# Patient Record
Sex: Female | Born: 1975
Health system: Southern US, Community
[De-identification: ages and names within clinical notes are randomized; demographics above are authoritative.]

## PROBLEM LIST (undated history)

## (undated) DIAGNOSIS — B977 Papillomavirus as the cause of diseases classified elsewhere: Secondary | ICD-10-CM

## (undated) DIAGNOSIS — R87619 Unspecified abnormal cytological findings in specimens from cervix uteri: Secondary | ICD-10-CM

## (undated) DIAGNOSIS — R011 Cardiac murmur, unspecified: Secondary | ICD-10-CM

## (undated) DIAGNOSIS — IMO0002 Reserved for concepts with insufficient information to code with codable children: Secondary | ICD-10-CM

## (undated) HISTORY — PX: WISDOM TOOTH EXTRACTION: SHX21

---

## 1996-08-28 HISTORY — PX: CERVICAL CONE BIOPSY: SUR198

## 2000-08-28 HISTORY — PX: RHINOPLASTY: SHX2354

## 2003-12-01 ENCOUNTER — Other Ambulatory Visit: Admission: RE | Admit: 2003-12-01 | Discharge: 2003-12-01 | Payer: Self-pay | Admitting: Obstetrics and Gynecology

## 2004-01-11 ENCOUNTER — Other Ambulatory Visit: Admission: RE | Admit: 2004-01-11 | Discharge: 2004-01-11 | Payer: Self-pay | Admitting: Obstetrics and Gynecology

## 2007-02-22 ENCOUNTER — Ambulatory Visit (HOSPITAL_COMMUNITY): Admission: RE | Admit: 2007-02-22 | Discharge: 2007-02-22 | Payer: Self-pay | Admitting: Obstetrics and Gynecology

## 2007-11-21 ENCOUNTER — Inpatient Hospital Stay (HOSPITAL_COMMUNITY): Admission: AD | Admit: 2007-11-21 | Discharge: 2007-11-25 | Payer: Self-pay | Admitting: Obstetrics and Gynecology

## 2007-11-22 ENCOUNTER — Encounter (INDEPENDENT_AMBULATORY_CARE_PROVIDER_SITE_OTHER): Payer: Self-pay | Admitting: Obstetrics and Gynecology

## 2008-01-23 ENCOUNTER — Ambulatory Visit: Payer: Self-pay | Admitting: Oncology

## 2008-03-20 ENCOUNTER — Ambulatory Visit: Payer: Self-pay | Admitting: Vascular Surgery

## 2008-04-03 ENCOUNTER — Ambulatory Visit: Payer: Self-pay | Admitting: Oncology

## 2009-07-16 ENCOUNTER — Encounter: Admission: RE | Admit: 2009-07-16 | Discharge: 2009-07-16 | Payer: Self-pay | Admitting: Obstetrics and Gynecology

## 2009-12-29 ENCOUNTER — Encounter: Admission: RE | Admit: 2009-12-29 | Discharge: 2009-12-29 | Payer: Self-pay | Admitting: Obstetrics and Gynecology

## 2010-08-28 NOTE — L&D Delivery Note (Signed)
Cesarean Section Procedure Note   [name]  03/22/2011  Indications: Scheduled Proceedure/Maternal Request   Pre-operative Diagnosis: previous cesarean section.   Post-operative Diagnosis: same plus breech   Surgeon: Surgeon(s) and Role:    * Kaiya Boatman C Adamary Savary, MD - Primary   Assistants: none  Anesthesia: spinal   Procedure Details:  The patient was seen in the Holding Room. The risks, benefits, complications, treatment options, and expected outcomes were discussed with the patient. The patient concurred with the proposed plan, giving informed consent. identified as Michele Medina and the procedure verified as C-Section Delivery. A Time Out was held and the above information confirmed.  After induction of anesthesia, the patient was draped and prepped in the usual sterile manner. A transverse was made and carried down through the subcutaneous tissue to the fascia. Fascial incision was made and extended transversely. The fascia was separated from the underlying rectus tissue superiorly and inferiorly. The peritoneum was identified and entered. Peritoneal incision was extended longitudinally. The utero-vesical peritoneal reflection was incised transversely and the bladder flap was bluntly freed from the lower uterine segment. A low transverse uterine incision was made. Delivered from breech  presentation was a  gram Living newborn infant(s) with Apgar scores of 9 at one minute and 9 at five minutes. Cord ph was sent the umbilical cord was clamped and cut cord blood was obtained for evaluation. The placenta was removed Intact and appeared normal. The uterus, tubes and ovaries appeared normal}. The uterine incision was closed with running locked sutures of 0monocry in double layerl.   Hemostasis was observed.  Irrigation was carried out until clear .The peritoneum was closed with  suture material 0 monocryl . The fascia was then reapproximated with running sutures of 0Vicryl.}. The skin was  closed with staples  Sponge and instrument count was normal x 3 and the patient was stable on transfer to recovery room.    Findings:   Estimated Blood Loss 500    Specimens: placenta to l and d  Specimens    None       Complications: {complications:[id number]  Baby condition / location:  nursery-stable  Signed: Surgeon(s): Aquan Kope C Catalea Labrecque, MD  

## 2010-10-20 ENCOUNTER — Inpatient Hospital Stay (HOSPITAL_COMMUNITY): Admission: AD | Admit: 2010-10-20 | Payer: Self-pay | Source: Ambulatory Visit | Admitting: Obstetrics and Gynecology

## 2011-01-10 NOTE — Op Note (Signed)
NAMECOURNEY, GARROD NO.:  [id number]   MEDICAL RECORD NO.:  [id number]          PATIENT TYPE:  INP   LOCATION:  9161                          FACILITY:  [facility]   PHYSICIAN:  Duke Salvia. Marcelle Overlie, M.D.DATE OF BIRTH:  [birth year]   DATE OF PROCEDURE:  11/22/2007  DATE OF DISCHARGE:                               OPERATIVE REPORT   PREOPERATIVE DIAGNOSES:  1. Failure to progress.  2. Cervical abnormality, failure to dilate, prior cold knife      conization.   POSTOPERATIVE DIAGNOSES:  1. Failure to progress.  2. Cervical abnormality, failure to dilate, prior cold knife      conization.   PROCEDURE:  Primary low transverse cesarean section.   SURGEON:  [place].   ANESTHESIA:  Epidural.   COMPLICATIONS:  None.   DRAINS:  Foley catheter.   BLOOD LOSS:  800.   PROCEDURE AND FINDINGS:  The patient was taken to the operating room.  After an adequate level of epidural anesthesia was obtained, the patient  in left tilt position.  The abdomen was prepped in the usual manner for  a sterile abdominal procedure.  A Foley catheter positioned, draining  clear urine.  A transverse Pfannenstiel incision made 2 fingerbreadths  above the symphysis in a transverse fashion after normal prep and drape.   This was carried down to the fascia, which was incised and extended  transversely.  Rectus muscle divided in the midline.  Peritoneum entered  superiorly without incident and extended vertically.  The vesicouterine  serosa was then incised and the bladder was bluntly and sharply  dissected below, bladder blade repositioned.  Transverse incision made  in the lower segment, which was moderately thin and extended with blunt  dissection.  Thin meconium was noted.  The infant was OP, gently rotated  to affect easy delivery of a female, Apgars 8 and 9, pH 7.31.  The  infant was suctioned, cord clamped and passed to the pediatric team for  further care.  Placenta was then delivered  manually intact, uterus  exteriorized, cavity wiped clean with a laparotomy pad.  Closure  obtained.  First layer of 0 chromic in a locked fashion, followed by an  imbricating layer with 0 chromic.  Prior to closure, the cervix was  dilated digitally from above since it had been impossible to figure out  the cervical os from below.  This was done mainly just to facilitate  postpartum drainage of blood.  The uterine incision was then closed in 2  layers and was noted be hemostatic.  Tubes and ovaries were normal on  each side.  Prior to closure sponge, needle and instrument counts were  reported correct x2.  Peritoneum closed with running 2-0 Vicryl suture.  Rectus muscles closed in the midline with 2-0 Vicryl interrupted sutures  and 0 PDS was used to close fascia from laterally to midline on either  side.  Subcutaneous tissue was hemostatic.  Clips and Steri-Strips used on the  skin.  Clear urine noted at the end of the case.  Did receive Pitocin IV  after the cord was clamped and  Ancef 1 gram IV during the case.  Mother  and baby doing well at that point.      Richard M. Marcelle Overlie, M.D.  Electronically Signed     RMH/MEDQ  D:  11/22/2007  T:  11/22/2007  Job:  [id number]

## 2011-01-13 NOTE — Discharge Summary (Signed)
NAMEVENA, BASSINGER NO.:  [id number]   MEDICAL RECORD NO.:  [id number]          PATIENT TYPE:  INP   LOCATION:  9318                          FACILITY:  [facility]   PHYSICIAN:  Dineen Kid. Rana Snare, M.D.    DATE OF BIRTH:  [birth year]   DATE OF ADMISSION:  11/21/2007  DATE OF DISCHARGE:  11/25/2007                               DISCHARGE SUMMARY   ADMITTING DIAGNOSES:  1. Intrauterine pregnancy at term.  2. Spontaneous onset of labor.  3. History of cold-knife conization.   DISCHARGE DIAGNOSES:  1. Status post low transverse cesarean section secondary to failure to      dilate.  2. Viable female infant.   PROCEDURE:  Primary low transverse cesarean section.   REASON FOR ADMISSION:  Please see dictated H&P.   HOSPITAL COURSE:  The patient is a 35 year old gravida 2, para 0,  abortus 1, who presented to [facility] at term in  labor.  Initially, the patient was thought to be fingertip dilated and  later was felt to be completely dilated.  She had a history of a cold-  knife conization.  On admission, vital signs were stable with blood  pressure 123/80.  Physician re-examined the patient and the cervix was  flushed with the vagina and could not find the internal os visually nor  by careful palpation.  Due to the patient's active labor and failure to  dilate, decision was made to proceed with a primary low transverse  cesarean section.  The patient was then transferred to the operating  room where epidural was dosed to an adequate surgical level.  A low  transverse incision was made with delivery of a viable female infant  weighing 8 pounds 1 ounce with Apgars of 8 at 1 minute and 9 and 5  minutes.  Arterial cord pH was 7.31.  The patient tolerated the  procedure well and taken to the recovery room in stable condition.  On  postoperative day 1, the patient was without complaint.  Vital signs  were stable.  She was afebrile.  Abdomen was soft with fundus  firm and  nontender.  Abdominal dressing was noted to be clean, dry, and intact.  Laboratory findings showed hemoglobin of 11.  On postoperative day 2,  the patient was without complaint.  Vital signs remained stable.  She  was afebrile.  Fundus firm and nontender.  Incision was clean, dry, and  intact.  The patient was ambulating well and tolerating a regular diet  without complaints of nausea or vomiting.  On postoperative day 3, the  patient was without complaint.  Vital signs remained stable.  She was  afebrile.  Fundus firm and nontender.  Incision was clean, dry, and  intact.  Staples removed and the patient was later discharged home.   CONDITION ON DISCHARGE:  Stable.   DIET:  Regular as tolerated.   ACTIVITY:  No heavy lifting or driving x2 weeks, no vaginal entry.   FOLLOW UP:  The patient is to follow up in the office in 1-2 weeks for  an incision check.  She is  to call for temperature greater than 100  degrees, persistent nausea, vomiting, heavy vaginal bleeding and/or  redness, or drainage from incisional site.   DISCHARGE MEDICATIONS:  1. Motrin 600 mg every 6 hours.  2. Prenatal vitamins 1 p.o. daily.  3. Colace 1 p.o. daily p.r.n.      Julio Sicks, N.P.      Dineen Kid Rana Snare, M.D.  Electronically Signed    CC/MEDQ  D:  12/12/2007  T:  12/12/2007  Job:  [id number]

## 2011-02-17 ENCOUNTER — Encounter (HOSPITAL_COMMUNITY): Payer: Self-pay | Admitting: *Deleted

## 2011-03-13 ENCOUNTER — Encounter (HOSPITAL_COMMUNITY)
Admission: RE | Admit: 2011-03-13 | Discharge: 2011-03-13 | Disposition: A | Discharge status: Home / Self Care | Payer: 59 | Source: Ambulatory Visit | Attending: Obstetrics and Gynecology | Admitting: Obstetrics and Gynecology

## 2011-03-13 ENCOUNTER — Encounter (HOSPITAL_COMMUNITY): Payer: Self-pay

## 2011-03-13 HISTORY — DX: Cardiac murmur, unspecified: R01.1

## 2011-03-13 LAB — CBC
HCT: 39.4 % (ref 36.0–46.0)
MCV: 88.5 fL (ref 78.0–100.0)
Platelets: 297 10*3/uL (ref 150–400)
RBC: 4.45 MIL/uL (ref 3.87–5.11)
RDW: 13.2 % (ref 11.5–15.5)
WBC: 16.7 10*3/uL — ABNORMAL HIGH (ref 4.0–10.5)

## 2011-03-13 NOTE — Anesthesia Preprocedure Evaluation (Addendum)
Anesthesia Evaluation  Name, MR# and DOB Patient awake  General Assessment Comment  Airway Mallampati: I TM Distance: >3 FB     Dental No notable dental hx    Pulmonaryneg pulmonary ROS      pulmonary exam normal   Cardiovascular    Neuro/PsychNegative Neurological ROS   GI/Hepatic/Renal negative GI ROS, negative Liver ROS, and negative Renal ROS (+)       Endo/Other  Negative Endocrine ROS (+)   Abdominal Normal abdominal exam  (+)   Musculoskeletal negative musculoskeletal ROS (+)  Hematology negative hematology ROS (+)   Peds negative pediatric ROS (+)  Reproductive/Obstetrics (+) Pregnancy   Anesthesia Other Findings             Anesthesia Physical Anesthesia Plan  ASA: II  Anesthesia Plan: Spinal   Post-op Pain Management:    Induction:   Airway Management Planned:   Additional Equipment:   Intra-op Plan:   Post-operative Plan:   Informed Consent: I have reviewed the patients History and Physical, chart, labs and discussed the procedure including the risks, benefits and alternatives for the proposed anesthesia with the patient or authorized representative who has indicated his/her understanding and acceptance.     Plan Discussed with:   Anesthesia Plan Comments:         Anesthesia Quick Evaluation

## 2011-03-13 NOTE — Pre-Procedure Instructions (Signed)
Pharmacy called twice but unable to show up during PAT appt.  Patient is only taking prenatal vitamins.

## 2011-03-13 NOTE — Patient Instructions (Signed)
20 ALEXIANA LAVERDURE  03/13/2011   Your procedure is scheduled on:  03/22/2011  Report to [facility] at 600 AM.  Call this number if you have problems the morning of surgery: [phone]   Remember:   Do not eat food:After Midnight.  Do not drink clear liquids: After Midnight.  Take these medicines the morning of surgery with A SIP OF WATER: none   Do not wear jewelry, make-up or nail polish.  Do not bring valuables to the hospital.  Contacts, dentures or bridgework may not be worn into surgery.  Leave suitcase in the car. After surgery it may be brought to your room.  For patients admitted to the hospital, checkout time is 11:00 AM the day of discharge.   Patients discharged the day of surgery will not be allowed to drive home.  Name and phone number of your driver: Tonda Wiederhold, [phone]  Special Instructions: Hibiclens bath Tuesday night and Wed morning prior to arrival to hospital.   Please read over the following fact sheets that you were given: Pain Booklet and MRSA Information

## 2011-03-19 ENCOUNTER — Inpatient Hospital Stay (HOSPITAL_COMMUNITY)
Admission: AD | Admit: 2011-03-19 | Discharge: 2011-03-19 | Disposition: A | Discharge status: Home / Self Care | Payer: 59 | Source: Ambulatory Visit | Attending: Obstetrics and Gynecology | Admitting: Obstetrics and Gynecology

## 2011-03-19 ENCOUNTER — Encounter (HOSPITAL_COMMUNITY): Payer: Self-pay | Admitting: *Deleted

## 2011-03-19 DIAGNOSIS — O99891 Other specified diseases and conditions complicating pregnancy: Secondary | ICD-10-CM | POA: Insufficient documentation

## 2011-03-19 HISTORY — DX: Papillomavirus as the cause of diseases classified elsewhere: B97.7

## 2011-03-19 HISTORY — DX: Reserved for concepts with insufficient information to code with codable children: IMO0002

## 2011-03-19 HISTORY — DX: Unspecified abnormal cytological findings in specimens from cervix uteri: R87.619

## 2011-03-19 NOTE — Progress Notes (Signed)
Dr. Marcelle Overlie notified of pt stating called and spoke to after-hour nurse about being wet after being outside and possibly sweating, one time this morning, no leakage running down legs.  Dr. Marcelle Overlie declined offer to order [facility] or spec exam.

## 2011-03-19 NOTE — Progress Notes (Signed)
Thinks water broke @ 10am.  Dr. Catalina Pizza her to come in. Scheduled c/s for breech on 03-22-11, G3pp1

## 2011-03-21 MED ORDER — DEXTROSE 5 % IV SOLN
1.0000 g | Freq: Once | INTRAVENOUS | Status: AC
  Administered 2011-03-22: 1 g via INTRAVENOUS
  Filled 2011-03-21: qty 1

## 2011-03-21 NOTE — H&P (Signed)
Michele Medina is a 35 y.o. female presenting for Repeat cesarean section.  EDC 03/29/11.  Prev C/S for FTP The pregnancy has been uncomplicated and she deisres repeat.Marland Kitchen History OB History    Grav Para Term Preterm Abortions TAB SAB Ect Mult Living   3 1 1  1  1   1      Past Medical History  Diagnosis Date  . Heart murmur     as child - no problems  . Abnormal Pap smear   . Human papilloma virus    Past Surgical History  Procedure Date  . Cesarean section 10/2007  . Rhinoplasty 2002  . Cervical cone biopsy 1998  . Wisdom tooth extraction    Family History: family history is not on file. Social History:  reports that she has never smoked. She has never used smokeless tobacco. She reports that she does not drink alcohol or use illicit drugs.  ROS    Last menstrual period 06/22/2010. Exam Physical Exam Cx cl/th/high Abd Gravid nontender and FH 38cm Prenatal labs: ABO, Rh:   Antibody:   Rubella:   RPR: NON REACTIVE (07/16 0850)  HBsAg:    HIV:    GBS:     Assessment/Plan: IUP at 39 weeks for repeat C/S.  R&B were discussed at length .  Informed consent obtained.   Michele Medina C 03/21/2011, 7:52 PM

## 2011-03-22 ENCOUNTER — Encounter (HOSPITAL_COMMUNITY): Payer: Self-pay | Admitting: Anesthesiology

## 2011-03-22 ENCOUNTER — Inpatient Hospital Stay (HOSPITAL_COMMUNITY): Payer: 59 | Admitting: Anesthesiology

## 2011-03-22 ENCOUNTER — Ambulatory Visit (HOSPITAL_COMMUNITY): Admission: RE | Admit: 2011-03-22 | Payer: 59 | Source: Ambulatory Visit | Admitting: Obstetrics and Gynecology

## 2011-03-22 ENCOUNTER — Inpatient Hospital Stay (HOSPITAL_COMMUNITY)
Admission: RE | Admit: 2011-03-22 | Discharge: 2011-03-24 | DRG: 766 | Disposition: A | Discharge status: Home / Self Care | Payer: 59 | Source: Ambulatory Visit | Attending: Obstetrics and Gynecology | Admitting: Obstetrics and Gynecology

## 2011-03-22 ENCOUNTER — Encounter (HOSPITAL_COMMUNITY)
Admission: RE | Disposition: A | Discharge status: Home / Self Care | Payer: Self-pay | Source: Ambulatory Visit | Attending: Obstetrics and Gynecology

## 2011-03-22 DIAGNOSIS — Z01812 Encounter for preprocedural laboratory examination: Secondary | ICD-10-CM

## 2011-03-22 DIAGNOSIS — Z01818 Encounter for other preprocedural examination: Secondary | ICD-10-CM

## 2011-03-22 DIAGNOSIS — O321XX0 Maternal care for breech presentation, not applicable or unspecified: Secondary | ICD-10-CM | POA: Diagnosis present

## 2011-03-22 DIAGNOSIS — O34219 Maternal care for unspecified type scar from previous cesarean delivery: Principal | ICD-10-CM | POA: Diagnosis present

## 2011-03-22 DIAGNOSIS — O09529 Supervision of elderly multigravida, unspecified trimester: Secondary | ICD-10-CM | POA: Diagnosis present

## 2011-03-22 SURGERY — Surgical Case
Anesthesia: Spinal | Site: Abdomen | Wound class: Clean Contaminated

## 2011-03-22 MED ORDER — ONDANSETRON HCL 4 MG/2ML IJ SOLN: 4.0000 mg | Freq: Once | INTRAMUSCULAR | Status: DC | PRN

## 2011-03-22 MED ORDER — NALBUPHINE SYRINGE 5 MG/0.5 ML
5.0000 mg | INJECTION | INTRAMUSCULAR | Status: AC | PRN
  Filled 2011-03-22: qty 1

## 2011-03-22 MED ORDER — SODIUM CHLORIDE 0.9 % IJ SOLN: 3.0000 mL | INTRAMUSCULAR | Status: DC | PRN

## 2011-03-22 MED ORDER — KETOROLAC TROMETHAMINE 30 MG/ML IJ SOLN: 30.0000 mg | Freq: Four times a day (QID) | INTRAMUSCULAR | Status: AC | PRN

## 2011-03-22 MED ORDER — MEPERIDINE HCL 25 MG/ML IJ SOLN: 6.2500 mg | INTRAMUSCULAR | Status: DC | PRN

## 2011-03-22 MED ORDER — LACTATED RINGERS IV SOLN
INTRAVENOUS | Status: DC
  Administered 2011-03-22 (×2): via INTRAVENOUS

## 2011-03-22 MED ORDER — SODIUM CHLORIDE 0.9 % IV SOLN
1.0000 ug/kg/h | INTRAVENOUS | Status: DC | PRN
  Filled 2011-03-22: qty 2.5

## 2011-03-22 MED ORDER — TETANUS-DIPHTH-ACELL PERTUSSIS 5-2.5-18.5 LF-MCG/0.5 IM SUSP: 0.5000 mL | Freq: Once | INTRAMUSCULAR | Status: DC

## 2011-03-22 MED ORDER — PHENYLEPHRINE 40 MCG/ML (10ML) SYRINGE FOR IV PUSH (FOR BLOOD PRESSURE SUPPORT)
PREFILLED_SYRINGE | INTRAVENOUS | Status: AC
  Filled 2011-03-22: qty 15

## 2011-03-22 MED ORDER — SCOPOLAMINE 1 MG/3DAYS TD PT72
1.0000 | MEDICATED_PATCH | Freq: Once | TRANSDERMAL | Status: DC
  Administered 2011-03-22: 1.5 mg via TRANSDERMAL

## 2011-03-22 MED ORDER — WITCH HAZEL-GLYCERIN EX PADS: MEDICATED_PAD | CUTANEOUS | Status: DC | PRN

## 2011-03-22 MED ORDER — OXYTOCIN 20 UNITS IN LACTATED RINGERS INFUSION - SIMPLE
INTRAVENOUS | Status: AC
  Administered 2011-03-22: 20000 m[IU]
  Filled 2011-03-22: qty 1000

## 2011-03-22 MED ORDER — OXYTOCIN 20 UNITS IN LACTATED RINGERS INFUSION - SIMPLE
INTRAVENOUS | Status: DC | PRN
  Administered 2011-03-22: 20 [IU] via INTRAVENOUS

## 2011-03-22 MED ORDER — IBUPROFEN 600 MG PO TABS
600.0000 mg | ORAL_TABLET | Freq: Four times a day (QID) | ORAL | Status: DC | PRN
  Filled 2011-03-22 (×8): qty 1

## 2011-03-22 MED ORDER — DIPHENHYDRAMINE HCL 25 MG PO CAPS: 25.0000 mg | ORAL_CAPSULE | Freq: Four times a day (QID) | ORAL | Status: DC | PRN

## 2011-03-22 MED ORDER — ONDANSETRON HCL 4 MG PO TABS: 4.0000 mg | ORAL_TABLET | ORAL | Status: DC | PRN

## 2011-03-22 MED ORDER — ZOLPIDEM TARTRATE 5 MG PO TABS: 5.0000 mg | ORAL_TABLET | Freq: Every evening | ORAL | Status: DC | PRN

## 2011-03-22 MED ORDER — KETOROLAC TROMETHAMINE 60 MG/2ML IM SOLN
INTRAMUSCULAR | Status: AC
  Administered 2011-03-22: 60 mg via INTRAMUSCULAR
  Filled 2011-03-22: qty 2

## 2011-03-22 MED ORDER — PHENYLEPHRINE HCL 10 MG/ML IJ SOLN
INTRAMUSCULAR | Status: DC | PRN
  Administered 2011-03-22 (×3): 40 ug via INTRAVENOUS

## 2011-03-22 MED ORDER — OXYTOCIN 20 UNITS IN LACTATED RINGERS INFUSION - SIMPLE: 125.0000 mL/h | INTRAVENOUS | Status: AC

## 2011-03-22 MED ORDER — ONDANSETRON HCL 4 MG/2ML IJ SOLN
INTRAMUSCULAR | Status: DC | PRN
  Administered 2011-03-22: 4 mg via INTRAVENOUS

## 2011-03-22 MED ORDER — IBUPROFEN 600 MG PO TABS: 600.0000 mg | ORAL_TABLET | Freq: Four times a day (QID) | ORAL | Status: DC | PRN

## 2011-03-22 MED ORDER — ONDANSETRON HCL 4 MG/2ML IJ SOLN
INTRAMUSCULAR | Status: AC
  Filled 2011-03-22: qty 2

## 2011-03-22 MED ORDER — OXYTOCIN 10 UNIT/ML IJ SOLN
INTRAMUSCULAR | Status: AC
  Filled 2011-03-22: qty 4

## 2011-03-22 MED ORDER — FENTANYL CITRATE 0.05 MG/ML IJ SOLN
INTRAMUSCULAR | Status: DC | PRN
Start: 2011-03-22 — End: 2011-03-22
  Administered 2011-03-22: 20 ug via INTRAVENOUS

## 2011-03-22 MED ORDER — KETOROLAC TROMETHAMINE 60 MG/2ML IM SOLN
60.0000 mg | Freq: Once | INTRAMUSCULAR | Status: AC | PRN
  Administered 2011-03-22: 60 mg via INTRAMUSCULAR

## 2011-03-22 MED ORDER — ONDANSETRON HCL 4 MG/2ML IJ SOLN: 4.0000 mg | INTRAMUSCULAR | Status: DC | PRN

## 2011-03-22 MED ORDER — SIMETHICONE 80 MG PO CHEW
80.0000 mg | CHEWABLE_TABLET | ORAL | Status: DC | PRN
  Administered 2011-03-24: 80 mg via ORAL

## 2011-03-22 MED ORDER — MENTHOL 3 MG MT LOZG: 1.0000 | LOZENGE | OROMUCOSAL | Status: DC | PRN

## 2011-03-22 MED ORDER — IBUPROFEN 600 MG PO TABS
600.0000 mg | ORAL_TABLET | Freq: Four times a day (QID) | ORAL | Status: DC
  Administered 2011-03-22 – 2011-03-24 (×8): 600 mg via ORAL
  Filled 2011-03-22: qty 1

## 2011-03-22 MED ORDER — SENNOSIDES-DOCUSATE SODIUM 8.6-50 MG PO TABS
1.0000 | ORAL_TABLET | Freq: Every day | ORAL | Status: DC
  Administered 2011-03-22: 2 via ORAL
  Administered 2011-03-23: 1 via ORAL

## 2011-03-22 MED ORDER — NALOXONE HCL 0.4 MG/ML IJ SOLN: 0.4000 mg | INTRAMUSCULAR | Status: DC | PRN

## 2011-03-22 MED ORDER — HYDROCODONE-IBUPROFEN 7.5-200 MG PO TABS: 1.0000 | ORAL_TABLET | Freq: Four times a day (QID) | ORAL | Status: DC | PRN

## 2011-03-22 MED ORDER — KETOROLAC TROMETHAMINE 60 MG/2ML IM SOLN: 60.0000 mg | Freq: Once | INTRAMUSCULAR | Status: AC | PRN

## 2011-03-22 MED ORDER — SCOPOLAMINE 1 MG/3DAYS TD PT72
MEDICATED_PATCH | TRANSDERMAL | Status: AC
  Administered 2011-03-22: 1.5 mg via TRANSDERMAL
  Filled 2011-03-22: qty 1

## 2011-03-22 MED ORDER — LACTATED RINGERS IV SOLN
INTRAVENOUS | Status: DC
  Administered 2011-03-22 (×3): via INTRAVENOUS

## 2011-03-22 MED ORDER — FENTANYL CITRATE 0.05 MG/ML IJ SOLN: 25.0000 ug | INTRAMUSCULAR | Status: DC | PRN

## 2011-03-22 MED ORDER — ACETAMINOPHEN 325 MG PO TABS: 325.0000 mg | ORAL_TABLET | ORAL | Status: DC | PRN

## 2011-03-22 MED ORDER — PRENATAL PLUS 27-1 MG PO TABS
1.0000 | ORAL_TABLET | Freq: Every day | ORAL | Status: DC
  Administered 2011-03-23 – 2011-03-24 (×2): 1 via ORAL
  Filled 2011-03-22 (×2): qty 1

## 2011-03-22 MED ORDER — SIMETHICONE 80 MG PO CHEW
80.0000 mg | CHEWABLE_TABLET | Freq: Three times a day (TID) | ORAL | Status: DC
  Administered 2011-03-22 – 2011-03-23 (×6): 80 mg via ORAL

## 2011-03-22 MED ORDER — FENTANYL CITRATE 0.05 MG/ML IJ SOLN
INTRAMUSCULAR | Status: AC
  Filled 2011-03-22: qty 2

## 2011-03-22 MED ORDER — MORPHINE SULFATE (PF) 0.5 MG/ML IJ SOLN
INTRAMUSCULAR | Status: DC | PRN
  Administered 2011-03-22: 150 ug via EPIDURAL

## 2011-03-22 MED ORDER — MORPHINE SULFATE 0.5 MG/ML IJ SOLN
INTRAMUSCULAR | Status: AC
  Filled 2011-03-22: qty 10

## 2011-03-22 MED ORDER — EPHEDRINE SULFATE 50 MG/ML IJ SOLN
INTRAMUSCULAR | Status: DC | PRN
  Administered 2011-03-22 (×8): 5 mg via INTRAVENOUS

## 2011-03-22 SURGICAL SUPPLY — 28 items
CLOTH BEACON ORANGE TIMEOUT ST (SAFETY) ×2 IMPLANT
DRAPE UTILITY XL STRL (DRAPES) ×2 IMPLANT
DRESSING TELFA 8X3 (GAUZE/BANDAGES/DRESSINGS) IMPLANT
ELECT REM PT RETURN 9FT ADLT (ELECTROSURGICAL) ×2
ELECTRODE REM PT RTRN 9FT ADLT (ELECTROSURGICAL) ×1 IMPLANT
EXTRACTOR VACUUM M CUP 4 TUBE (SUCTIONS) IMPLANT
GAUZE SPONGE 4X4 12PLY STRL LF (GAUZE/BANDAGES/DRESSINGS) IMPLANT
GLOVE BIO SURGEON STRL SZ8 (GLOVE) ×2 IMPLANT
GLOVE SURG ORTHO 8.0 STRL STRW (GLOVE) ×2 IMPLANT
GOWN PREVENTION PLUS LG XLONG (DISPOSABLE) ×4 IMPLANT
GOWN STRL REIN XL XLG (GOWN DISPOSABLE) ×2 IMPLANT
KIT ABG SYR 3ML LUER SLIP (SYRINGE) ×2 IMPLANT
NDL HYPO 25X5/8 SAFETYGLIDE (NEEDLE) ×1 IMPLANT
NEEDLE HYPO 25X5/8 SAFETYGLIDE (NEEDLE) ×2 IMPLANT
NS IRRIG 1000ML POUR BTL (IV SOLUTION) ×2 IMPLANT
PACK C SECTION WH (CUSTOM PROCEDURE TRAY) ×2 IMPLANT
PAD ABD 7.5X8 STRL (GAUZE/BANDAGES/DRESSINGS) IMPLANT
SLEEVE SCD COMPRESS KNEE MED (MISCELLANEOUS) IMPLANT
STAPLER VISISTAT 35W (STAPLE) ×1 IMPLANT
SUT MNCRL 0 VIOLET CTX 36 (SUTURE) ×3 IMPLANT
SUT MONOCRYL 0 CTX 36 (SUTURE) ×3
SUT PDS AB 1 CT  36 (SUTURE)
SUT PDS AB 1 CT 36 (SUTURE) IMPLANT
SUT VIC AB 1 CTX 36 (SUTURE) ×2
SUT VIC AB 1 CTX36XBRD ANBCTRL (SUTURE) IMPLANT
TOWEL OR 17X24 6PK STRL BLUE (TOWEL DISPOSABLE) ×4 IMPLANT
TRAY FOLEY CATH 14FR (SET/KITS/TRAYS/PACK) ×2 IMPLANT
WATER STERILE IRR 1000ML POUR (IV SOLUTION) ×2 IMPLANT

## 2011-03-22 NOTE — Procedures (Signed)
Cesarean Section Procedure Note   Michele Medina  03/22/2011  Indications: Scheduled Proceedure/Maternal Request   Pre-operative Diagnosis: previous cesarean section.   Post-operative Diagnosis: same plus breech   Surgeon: Surgeon(s) and Role:    * Turner Daniels, MD - Primary   Assistants: none  Anesthesia: spinal   Procedure Details:  The patient was seen in the Holding Room. The risks, benefits, complications, treatment options, and expected outcomes were discussed with the patient. The patient concurred with the proposed plan, giving informed consent. identified as Michele Medina and the procedure verified as C-Section Delivery. A Time Out was held and the above information confirmed.  After induction of anesthesia, the patient was draped and prepped in the usual sterile manner. A transverse was made and carried down through the subcutaneous tissue to the fascia. Fascial incision was made and extended transversely. The fascia was separated from the underlying rectus tissue superiorly and inferiorly. The peritoneum was identified and entered. Peritoneal incision was extended longitudinally. The utero-vesical peritoneal reflection was incised transversely and the bladder flap was bluntly freed from the lower uterine segment. A low transverse uterine incision was made. Delivered from breech  presentation was a  gram Living newborn infant(s) with Apgar scores of 9 at one minute and 9 at five minutes. Cord ph was sent the umbilical cord was clamped and cut cord blood was obtained for evaluation. The placenta was removed Intact and appeared normal. The uterus, tubes and ovaries appeared normal}. The uterine incision was closed with running locked sutures of in double layerl.   Hemostasis was observed.  Irrigation was carried out until clear .The peritoneum was closed with  suture material 0 monocryl . The fascia was then reapproximated with running sutures of 0Vicryl.}. The skin was  closed with staples  Sponge and instrument count was normal x 3 and the patient was stable on transfer to recovery room.    Findings:   Estimated Blood Loss 500    Specimens: placenta to l and d  Specimens    None       Complications: {complications:[id number]  Baby condition / location:  nursery-stable  Signed: Surgeon(s): Turner Daniels, MD

## 2011-03-22 NOTE — Progress Notes (Signed)
Postural BP 77/49.  Lying BP 101/57.  Patient became dizzy and did not ambulate.  Once patient sat back down in bed, she had no further complaints of dizziness.  Notified Dr. Jean Rosenthal per orders.  No new orders received.  Earl Gala, Linda Hedges [place]

## 2011-03-22 NOTE — Consult Note (Signed)
Called to attend scheduled repeat C/section for 35 yo G3 P1 mother at [redacted] wks EGA after uncomplicated pregnancy.  No labor, AROM with clear fluid at delivery.  Breech extraction.  Infant vigorous -  No resuscitation needed. Wrapped and shown to mother, then taken to CN for further care per Dr. Christean Leaf Peds  JWimmer,MD

## 2011-03-22 NOTE — Anesthesia Postprocedure Evaluation (Signed)
  Anesthesia Post-op Note  Patient: [name]  Procedure(s) Performed:  CESAREAN SECTION - Repeat  Patient is awake, responsive, moving her legs, and has signs of resolution of her numbness. Pain and nausea are reasonably well controlled. Vital signs are stable and clinically acceptable. Oxygen saturation is clinically acceptable. There are no apparent anesthetic complications at this time. Patient is ready for discharge.

## 2011-03-22 NOTE — Progress Notes (Signed)
Lactation brochure reviewed with mom, community resources for breastfeeding mothers discussed, advised of outpatient services if needed.

## 2011-03-22 NOTE — Transfer of Care (Signed)
Immediate Anesthesia Transfer of Care Note  Patient: Michele Medina  Procedure(s) Performed:  CESAREAN SECTION - Repeat  Patient Location: PACU  Anesthesia Type: Spinal  Level of Consciousness: awake, alert  and oriented  Airway & Oxygen Therapy: Patient Spontanous Breathing  Post-op Assessment: Report given to PACU RN and Post -op Vital signs reviewed and stable  Post vital signs: Reviewed and stable  Complications: No apparent anesthesia complications

## 2011-03-22 NOTE — Anesthesia Procedure Notes (Signed)
Spinal Block  Patient location during procedure: OR Staffing Anesthesiologist: Rabiah Goeser EDWARD Preanesthetic Checklist Completed: patient identified, site marked, surgical consent, pre-op evaluation, timeout performed, IV checked, risks and benefits discussed and monitors and equipment checked Spinal Block Patient position: sitting Prep: DuraPrep Patient monitoring: heart rate, cardiac monitor, continuous pulse ox and blood pressure Approach: midline Location: L3-4 Injection technique: single-shot Needle Needle type: Sprotte  Needle gauge: 24 G Needle length: 9 cm Assessment Sensory level: T4 Additional Notes Spinal Dosage in OR  Bupivicaine ml       1.5 PFMS04   mcg        150 Fentanyl mcg            20    

## 2011-03-23 LAB — CBC
Platelets: 250 10*3/uL (ref 150–400)
RDW: 13.7 % (ref 11.5–15.5)
WBC: 15 10*3/uL — ABNORMAL HIGH (ref 4.0–10.5)

## 2011-03-23 MED ORDER — KETOROLAC TROMETHAMINE 60 MG/2ML IM SOLN
INTRAMUSCULAR | Status: AC
  Filled 2011-03-23: qty 2

## 2011-03-23 NOTE — Progress Notes (Signed)
Subjective: Postpartum Day 1: Cesarean Delivery Patient reports tolerating PO and + flatus.    Objective: Vital signs in last 24 hours: Temp:  [97.6 F (36.4 C)-99.5 F (37.5 C)] 98.4 F (36.9 C) (07/26 0516) Pulse Rate:  [60-89] 84  (07/26 0516) Resp:  [16-20] 18  (07/26 0516) BP: (77-122)/(49-70) 96/61 mmHg (07/26 0516) SpO2:  [95 %-100 %] 96 % (07/26 0516) Weight:  [72.576 kg (160 lb)] 160 lb (72.576 kg) (07/25 1245)  Physical Exam:  General: alert, cooperative and no distress Lochia: appropriate Uterine Fundus: firm ABd dressing CDI Foley discontinued and patient has voided x1 with adequate op DVT Evaluation: No evidence of DVT seen on physical exam.   Basename 03/23/11 0540  HGB 9.8*  HCT 29.8*    Assessment/Plan: Status post Cesarean section. Doing well postoperatively.  Continue current care.  CURTIS,CAROL G 03/23/2011, 8:26 AM

## 2011-03-23 NOTE — Progress Notes (Signed)
Experienced BF Mother. States BF going well. Baby was circ'd this am. Questions about pumping at work answered. No further questions.

## 2011-03-24 MED ORDER — IBUPROFEN 600 MG PO TABS: 600.0000 mg | ORAL_TABLET | Freq: Four times a day (QID) | ORAL | Status: AC | PRN

## 2011-03-24 NOTE — Progress Notes (Signed)
Multiple, frequent swallows heard.  Mom's nipples w/mild abrasions.  Discussed proper shape of nipple after feed, etc.  Michele Medina, Michele Medina [name]

## 2011-03-24 NOTE — Progress Notes (Signed)
Subjective: Postpartum Day 2: Cesarean Delivery Patient reports tolerating PO, + flatus and no problems voiding.  Desires early discharge  Objective: Vital signs in last 24 hours: Temp:  [97.9 F (36.6 C)-98.1 F (36.7 C)] 97.9 F (36.6 C) (07/27 0642) Pulse Rate:  [73-83] 73  (07/27 0642) Resp:  [18] 18  (07/27 0642) BP: (90-98)/(53-61) 98/61 mmHg (07/27 0642) SpO2:  [96 %] 96 % (07/26 1900)  Physical Exam:  General: alert and cooperative Lochia: appropriate Uterine Fundus: firm Incision: healing well, staples intact DVT Evaluation: No evidence of DVT seen on physical exam.   Basename 03/23/11 0540  HGB 9.8*  HCT 29.8*    Assessment/Plan: Status post Cesarean section. Doing well postoperatively.  Discharge home with standard precautions and return to clinic in 2-3 days for staple removal.  Thoren Hosang G 03/24/2011, 8:06 AM

## 2011-03-24 NOTE — Discharge Summary (Signed)
Obstetric Discharge Summary Reason for Admission: cesarean section Prenatal Procedures: none Intrapartum Procedures: cesarean: low cervical, transverse Postpartum Procedures: none Complications-Operative and Postpartum: none  Hemoglobin  Date Value Range Status  03/23/2011 9.8* 12.0-15.0 (g/dL) Final     HCT  Date Value Range Status  03/23/2011 29.8* 36.0-46.0 (%) Final    Discharge Diagnoses: Term Pregnancy-delivered  Discharge Information: Date: 03/24/2011 Activity: pelvic rest Diet: routine Medications: PNV and Ibuprophen and Oxycodone 5 mgs Condition: stable Instructions: refer to practice specific booklet Discharge to: home   Newborn Data: Live born  Information for the patient's newborn:  Kolbi, Tofte [[id number]]  female  Home with mother. RTO in 2-3 days for staple removal  Karryn Kosinski G 03/24/2011, 8:17 AM

## 2011-03-24 NOTE — Progress Notes (Signed)
Mom reports nipple misshapening with feeds.  Mom to call for next feed.  Lurline Hare [place]

## 2011-04-13 ENCOUNTER — Encounter (HOSPITAL_COMMUNITY): Payer: Self-pay | Admitting: Obstetrics and Gynecology

## 2011-05-08 IMAGING — MG MM DIGITAL DIAGNOSTIC BILAT
5 series · 5 of 5 positions shown · non-contrast
Comparison: None

CLINICAL DATA: The patient has completed breast feeding 2 months
ago, now feels palpable right subareolar area

DIGITAL DIAGNOSTIC  BILATERAL  MAMMOGRAM  WITH CAD AND  RIGHT
BREAST ULTRASOUND:

[R CC]
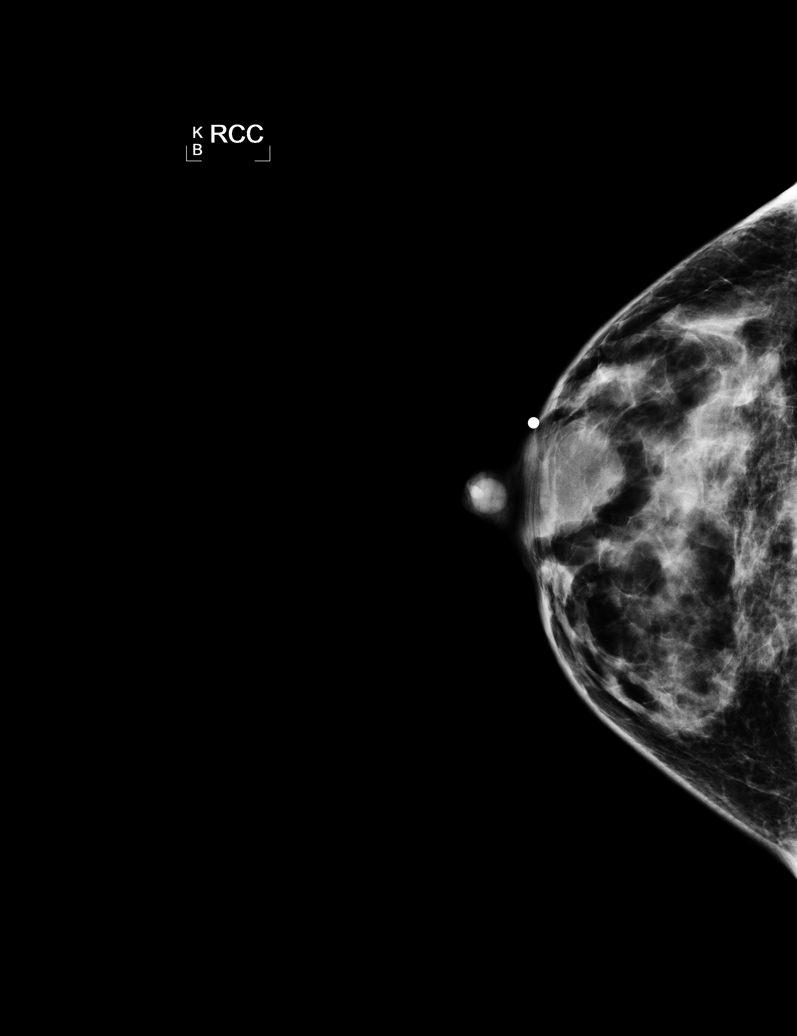

[L CC]
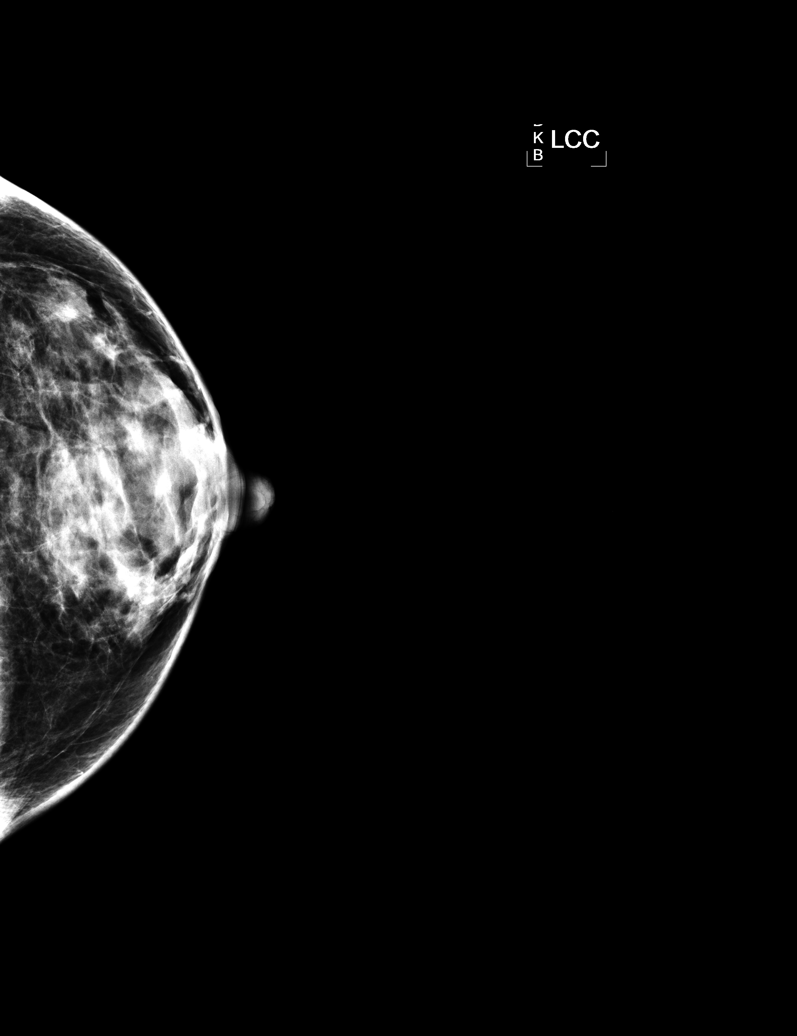

[L MLO]
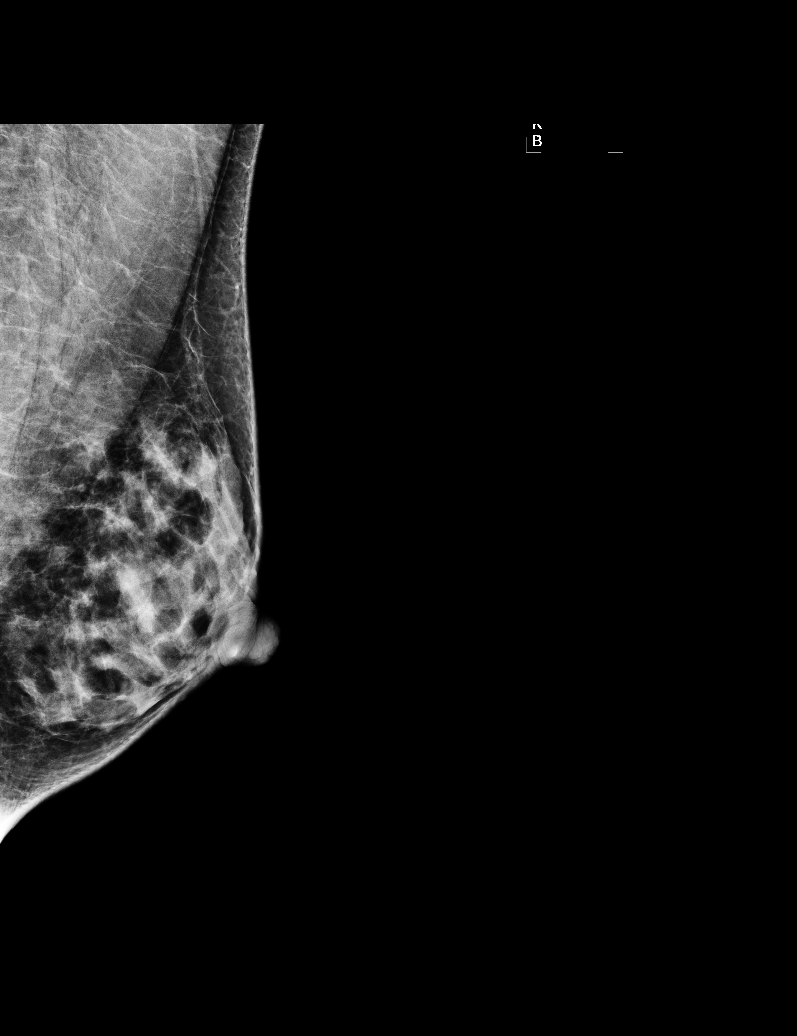

[R MLO]
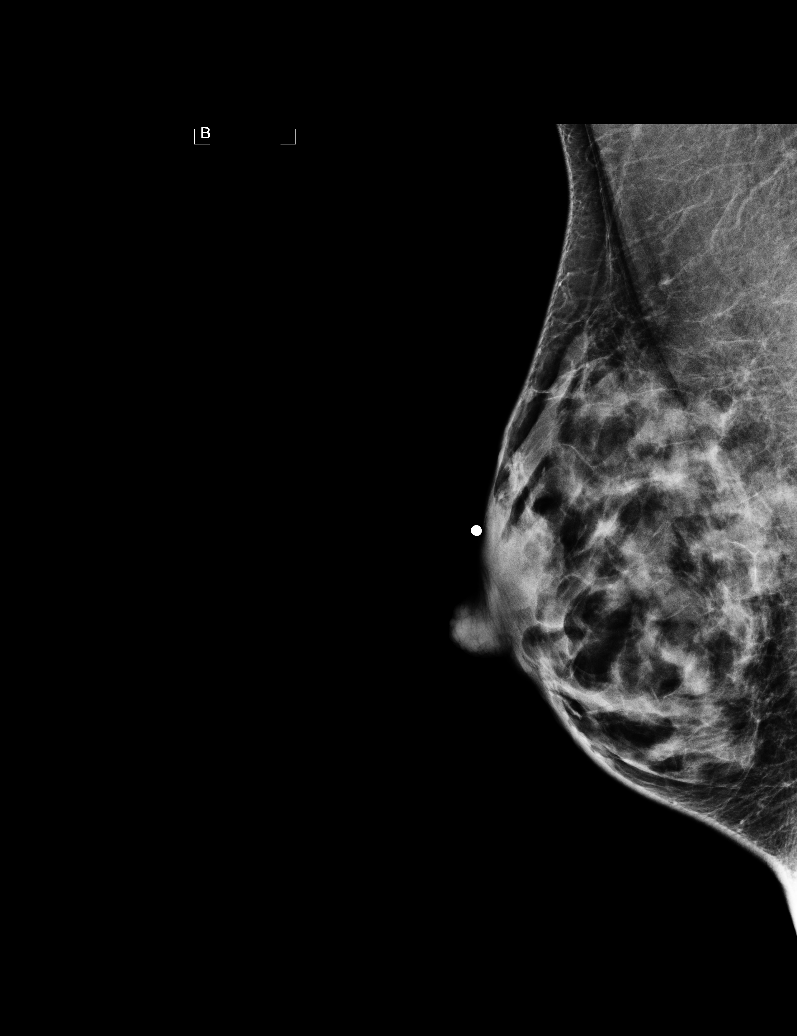

[R TAN]
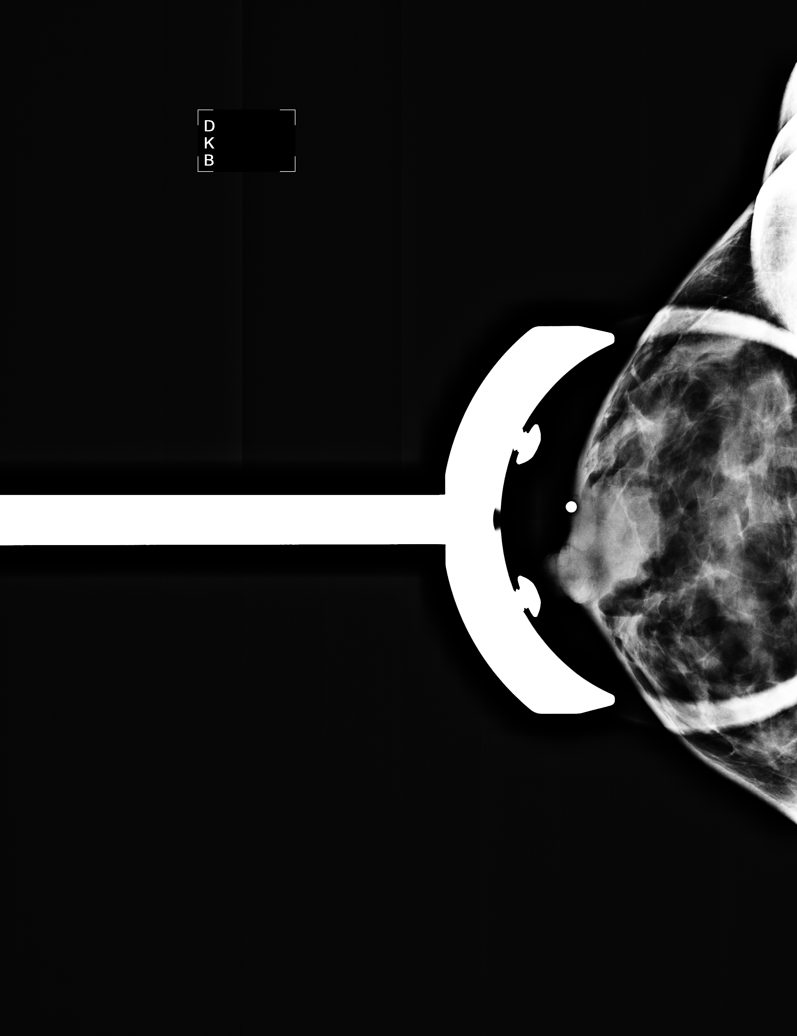

[5 of 5 positions shown; findings below may reference images not displayed]

FINDINGS: Heterogeneously dense breast tissue.  Negative left
breast.  Area of asymmetry subareolar right breast corresponding to
palpable abnormality.  On spot compression view no definite mass or
architectural distortion however.
Mammographic images were processed with CAD.

On physical exam, the palpable area is a ridge of prominent tissue
in the 10 o'clock periareolar area with no focal mass appreciated.

Ultrasound is performed, showing dense fibroglandular tissue in the
area of interest.  No mass or cyst.
IMPRESSION: The area of palpable concern appears to correlate to an island of
prominent fibroglandular tissue.

BI-RADS CATEGORY 3:  Probably benign finding(s) - short interval
follow-up suggested.

Recommendation:

Diagnostic right mammogram and ultrasound in 6 months.

## 2011-05-12 ENCOUNTER — Encounter (HOSPITAL_COMMUNITY): Payer: Self-pay | Admitting: *Deleted

## 2011-05-22 LAB — CBC
HCT: 32.5 — ABNORMAL LOW
Hemoglobin: 11.1 — ABNORMAL LOW
Hemoglobin: 12.4
MCV: 87.6
RBC: 3.7 — ABNORMAL LOW
RBC: 4.13
RDW: 13.5
WBC: 15.5 — ABNORMAL HIGH
WBC: 17.4 — ABNORMAL HIGH

## 2012-10-15 ENCOUNTER — Other Ambulatory Visit (HOSPITAL_COMMUNITY): Payer: Self-pay | Admitting: Otolaryngology

## 2012-10-15 DIAGNOSIS — Q892 Congenital malformations of other endocrine glands: Secondary | ICD-10-CM

## 2012-10-17 ENCOUNTER — Emergency Department (HOSPITAL_COMMUNITY)
Admission: EM | Admit: 2012-10-17 | Discharge: 2012-10-17 | Disposition: A | Discharge status: Home / Self Care | Payer: 59 | Source: Home / Self Care | Attending: Family Medicine | Admitting: Family Medicine

## 2012-10-17 ENCOUNTER — Other Ambulatory Visit (HOSPITAL_COMMUNITY): Payer: Self-pay | Admitting: Otolaryngology

## 2012-10-17 ENCOUNTER — Encounter (HOSPITAL_COMMUNITY): Payer: Self-pay | Admitting: Emergency Medicine

## 2012-10-17 ENCOUNTER — Ambulatory Visit (HOSPITAL_COMMUNITY)
Admission: RE | Admit: 2012-10-17 | Discharge: 2012-10-17 | Disposition: A | Discharge status: Home / Self Care | Payer: 59 | Source: Ambulatory Visit | Attending: Otolaryngology | Admitting: Otolaryngology

## 2012-10-17 DIAGNOSIS — Q892 Congenital malformations of other endocrine glands: Secondary | ICD-10-CM

## 2012-10-17 DIAGNOSIS — R220 Localized swelling, mass and lump, head: Secondary | ICD-10-CM | POA: Insufficient documentation

## 2012-10-17 DIAGNOSIS — J069 Acute upper respiratory infection, unspecified: Secondary | ICD-10-CM

## 2012-10-17 LAB — POCT RAPID STREP A: Streptococcus, Group A Screen (Direct): NEGATIVE

## 2012-10-17 MED ORDER — IPRATROPIUM BROMIDE 0.06 % NA SOLN: 2.0000 | Freq: Four times a day (QID) | NASAL | Status: DC

## 2012-10-17 NOTE — ED Provider Notes (Signed)
History     CSN: [id number]  Arrival date & time 10/17/12  1508   First MD Initiated Contact with Patient 10/17/12 1616      Chief Complaint  Patient presents with  . Sore Throat    (Consider location/radiation/quality/duration/timing/severity/associated sxs/prior treatment) Patient is a 37 y.o. female presenting with pharyngitis. The history is provided by the patient.  Sore Throat This is a new problem. The current episode started more than 2 days ago. The problem has not changed since onset.The symptoms are aggravated by swallowing.    Past Medical History  Diagnosis Date  . Heart murmur     as child - no problems  . Abnormal Pap smear   . Human papilloma virus     Past Surgical History  Procedure Laterality Date  . Cesarean section  10/2007  . Rhinoplasty  2002  . Cervical cone biopsy  1998  . Wisdom tooth extraction    . Cesarean section  03/22/2011    Procedure: CESAREAN SECTION;  Surgeon: Turner Daniels, MD;  Location: [facility];  Service: Gynecology;  Laterality: N/A;  Repeat    No family history on file.  History  Substance Use Topics  . Smoking status: Never Smoker   . Smokeless tobacco: Never Used  . Alcohol Use: No    OB History   Grav Para Term Preterm Abortions TAB SAB Ect Mult Living   3 2 2  1  1   2       Review of Systems  Constitutional: Negative.  Negative for fever and chills.  HENT: Positive for congestion, sore throat, rhinorrhea, voice change and postnasal drip. Negative for trouble swallowing.   Respiratory: Negative.   Cardiovascular: Negative.   Skin: Negative for rash.    Allergies  Tylenol  Home Medications   Current Outpatient Rx  Name  Route  Sig  Dispense  Refill  . ipratropium (ATROVENT) 0.06 % nasal spray   Nasal   Place 2 sprays into the nose 4 (four) times daily.   15 mL   1   . Prenatal Vit-Fe Fum-FA-Omega (PRENATAL MULTI +DHA PO)   Oral   Take 1 tablet by mouth at bedtime.             BP 129/83  Pulse 109   Temp(Src) 100.7 F (38.2 C) (Oral)  SpO2 100%  Breastfeeding? Yes  Physical Exam  Nursing note and vitals reviewed. Constitutional: She is oriented to person, place, and time. She appears well-developed and well-nourished.  HENT:  Head: Normocephalic.  Right Ear: External ear normal.  Left Ear: External ear normal.  Nose: Nose normal.  Mouth/Throat: Oropharynx is clear and moist.  Eyes: Pupils are equal, round, and reactive to light.  Neck: Normal range of motion. Neck supple.  Cardiovascular: Normal rate and normal heart sounds.   Pulmonary/Chest: Effort normal and breath sounds normal.  Lymphadenopathy:    She has cervical adenopathy.  Neurological: She is alert and oriented to person, place, and time.  Skin: Skin is warm and dry.    ED Course  Procedures (including critical care time)  Labs Reviewed  POCT RAPID STREP A ([facility] C[facility] ONLY)   US Soft Tissue Head/neck  10/17/2012  *RADIOLOGY REPORT*  Clinical Data: Neck mass.  ULTRASOUND OF HEAD/NECK SOFT TISSUES  Technique:  Ultrasound examination of the head and neck soft tissues was performed in the area of clinical concern.  Comparison:  None.  Findings: At the site of palpable abnormality in the  midline neck, there are two lymph nodes measuring up to 1.5 cm in long axis. Additional lymph nodes are seen in the lateral aspect of the neck bilaterally, measuring up to 2.4 cm in long axis on the right and 2.5 cm in long axis on the left.  IMPRESSION: Scattered lymph nodes in the neck, some of which are mildly enlarged.   Original Report Authenticated By: Leanna Battles, M.D.      1. URI (upper respiratory infection)       MDM  Rapid strep  Neg.        Linna Hoff, MD 10/17/12 [phone]

## 2012-10-17 NOTE — ED Notes (Signed)
Pt c/o sore throat since Saturday Sx include: chest congestion, dysphagia Denies: f/v/n/d, cough, runny nose Took advil today around 0500  She is alert w/no signs of acute distress.

## 2014-06-29 ENCOUNTER — Encounter (HOSPITAL_COMMUNITY): Payer: Self-pay | Admitting: Emergency Medicine

## 2014-07-13 ENCOUNTER — Other Ambulatory Visit: Payer: Self-pay | Admitting: Obstetrics and Gynecology

## 2014-07-14 LAB — CYTOLOGY - PAP

## 2014-09-15 ENCOUNTER — Other Ambulatory Visit (HOSPITAL_COMMUNITY): Payer: Self-pay | Admitting: Urology

## 2014-09-15 DIAGNOSIS — R3129 Other microscopic hematuria: Secondary | ICD-10-CM

## 2014-09-21 ENCOUNTER — Ambulatory Visit (HOSPITAL_COMMUNITY)
Admission: RE | Admit: 2014-09-21 | Discharge: 2014-09-21 | Disposition: A | Discharge status: Home / Self Care | Payer: 59 | Source: Ambulatory Visit | Attending: Urology | Admitting: Urology

## 2014-09-21 ENCOUNTER — Encounter (HOSPITAL_COMMUNITY): Payer: Self-pay

## 2014-09-21 DIAGNOSIS — R934 Abnormal findings on diagnostic imaging of urinary organs: Secondary | ICD-10-CM | POA: Diagnosis not present

## 2014-09-21 DIAGNOSIS — R312 Other microscopic hematuria: Secondary | ICD-10-CM | POA: Diagnosis not present

## 2014-09-21 DIAGNOSIS — R932 Abnormal findings on diagnostic imaging of liver and biliary tract: Secondary | ICD-10-CM | POA: Insufficient documentation

## 2014-09-21 DIAGNOSIS — R3129 Other microscopic hematuria: Secondary | ICD-10-CM

## 2014-09-21 MED ORDER — IOHEXOL 300 MG/ML  SOLN
100.0000 mL | Freq: Once | INTRAMUSCULAR | Status: AC | PRN
  Administered 2014-09-21: 100 mL via INTRAVENOUS

## 2014-11-09 ENCOUNTER — Emergency Department (INDEPENDENT_AMBULATORY_CARE_PROVIDER_SITE_OTHER): Payer: 59

## 2014-11-09 ENCOUNTER — Encounter (HOSPITAL_COMMUNITY): Payer: Self-pay

## 2014-11-09 ENCOUNTER — Emergency Department (HOSPITAL_COMMUNITY)
Admission: EM | Admit: 2014-11-09 | Discharge: 2014-11-09 | Disposition: A | Discharge status: Home / Self Care | Payer: 59 | Source: Home / Self Care | Attending: Family Medicine | Admitting: Family Medicine

## 2014-11-09 DIAGNOSIS — S51852A Open bite of left forearm, initial encounter: Secondary | ICD-10-CM

## 2014-11-09 MED ORDER — TETANUS-DIPHTH-ACELL PERTUSSIS 5-2.5-18.5 LF-MCG/0.5 IM SUSP
INTRAMUSCULAR | Status: AC
  Filled 2014-11-09: qty 0.5

## 2014-11-09 MED ORDER — TETANUS-DIPHTH-ACELL PERTUSSIS 5-2.5-18.5 LF-MCG/0.5 IM SUSP
0.5000 mL | Freq: Once | INTRAMUSCULAR | Status: AC
  Administered 2014-11-09: 0.5 mL via INTRAMUSCULAR

## 2014-11-09 MED ORDER — AMOXICILLIN-POT CLAVULANATE 875-125 MG PO TABS: 1.0000 | ORAL_TABLET | Freq: Two times a day (BID) | ORAL | Status: DC

## 2014-11-09 NOTE — Discharge Instructions (Signed)
I see no evidence of retained foreign body on your films. Please keep wound clean and dry and monitor closely for signs of infection. Only about 5% of dog bites become infected, however, if you develop redness, increased swelling or purulent drainage from wound, please begin taking Augmentin as prescribed and contacted your PCP or [facility].  Animal Bite An animal bite can result in a scratch on the skin, deep open cut, puncture of the skin, crush injury, or tearing away of the skin or a body part. Dogs are responsible for most animal bites. Children are bitten more often than adults. An animal bite can range from very mild to more serious. A small bite from your house pet is no cause for alarm. However, some animal bites can become infected or injure a bone or other tissue. You must seek medical care if:  The skin is broken and bleeding does not slow down or stop after 15 minutes.  The puncture is deep and difficult to clean (such as a cat bite).  Pain, warmth, redness, or pus develops around the wound.  The bite is from a stray animal or rodent. There may be a risk of rabies infection.  The bite is from a snake, raccoon, skunk, fox, coyote, or bat. There may be a risk of rabies infection.  The person bitten has a chronic illness such as diabetes, liver disease, or cancer, or the person takes medicine that lowers the immune system.  There is concern about the location and severity of the bite. It is important to clean and protect an animal bite wound right away to prevent infection. Follow these steps:  Clean the wound with plenty of water and soap.  Apply an antibiotic cream.  Apply gentle pressure over the wound with a clean towel or gauze to slow or stop bleeding.  Elevate the affected area above the heart to help stop any bleeding.  Seek medical care. Getting medical care within 8 hours of the animal bite leads to the best possible outcome. DIAGNOSIS  Your  caregiver will most likely:  Take a detailed history of the animal and the bite injury.  Perform a wound exam.  Take your medical history. Blood tests or X-rays may be performed. Sometimes, infected bite wounds are cultured and sent to a lab to identify the infectious bacteria.  TREATMENT  Medical treatment will depend on the location and type of animal bite as well as the patient's medical history. Treatment may include:  Wound care, such as cleaning and flushing the wound with saline solution, bandaging, and elevating the affected area.  Antibiotics.  Tetanus immunization.  Rabies immunization.  Leaving the wound open to heal. This is often done with animal bites, due to the high risk of infection. However, in certain cases, wound closure with stitches, wound adhesive, skin adhesive strips, or staples may be used. Infected bites that are left untreated may require intravenous (IV) antibiotics and surgical treatment in the hospital. HOME CARE INSTRUCTIONS  Follow your caregiver's instructions for wound care.  Take all medicines as directed.  If your caregiver prescribes antibiotics, take them as directed. Finish them even if you start to feel better.  Follow up with your caregiver for further exams or immunizations as directed. You may need a tetanus shot if:  You cannot remember when you had your last tetanus shot.  You have never had a tetanus shot.  The injury broke your skin. If you get a tetanus shot, your  arm may swell, get red, and feel warm to the touch. This is common and not a problem. If you need a tetanus shot and you choose not to have one, there is a rare chance of getting tetanus. Sickness from tetanus can be serious. SEEK MEDICAL CARE IF:  You notice warmth, redness, soreness, swelling, pus discharge, or a bad smell coming from the wound.  You have a red line on the skin coming from the wound.  You have a fever, chills, or a general ill feeling.  You  have nausea or vomiting.  You have continued or worsening pain.  You have trouble moving the injured part.  You have other questions or concerns. MAKE SURE YOU:  Understand these instructions.  Will watch your condition.  Will get help right away if you are not doing well or get worse. Document Released: 05/02/2011 Document Revised: 11/06/2011 Document Reviewed: 05/02/2011 [facility] Patient Information 2015 [place], [place]. This information is not intended to replace advice given to you by your health care provider. Make sure you discuss any questions you have with your health care provider.

## 2014-11-09 NOTE — ED Notes (Signed)
2 telfa pads to go. Pt to apply to back after her shower.

## 2014-11-09 NOTE — ED Notes (Signed)
Form faxed to [employer] and confirmation received.

## 2014-11-09 NOTE — ED Provider Notes (Signed)
CSN: [id number]     Arrival date & time 11/09/14  1951 History   First MD Initiated Contact with Patient 11/09/14 2035     Chief Complaint  Patient presents with  . Animal Bite   (Consider location/radiation/quality/duration/timing/severity/associated sxs/prior Treatment) HPI Comments: Patient she was getting ready to leave her house to walk her dog when neighbor's dog came running into her house and began fighting with her dog. Patient was pushed to the floor while trying to separate the two animals and suffered abrasions to right foot, right ankle, right thigh and bite to her left forearm. Is uncertain if it was her dog or the neighbor's dog that bit her. States she thinks immunizations for both animals are UTD. Here for evaluation of puncture wound to left fore arm and tetanus booster as she thinks her last booster was 6-7 years ago.  Reports herself to be otherwise healthy.   Patient is a 39 y.o. female presenting with animal bite. The history is provided by the patient.  Animal Bite Contact animal:  Dog   Past Medical History  Diagnosis Date  . Heart murmur     as child - no problems  . Abnormal Pap smear   . Human papilloma virus    Past Surgical History  Procedure Laterality Date  . Cesarean section  10/2007  . Rhinoplasty  2002  . Cervical cone biopsy  1998  . Wisdom tooth extraction    . Cesarean section  03/22/2011    Procedure: CESAREAN SECTION;  Surgeon: Turner Danielsavid C Lowe, MD;  Location: [facility];  Service: Gynecology;  Laterality: N/A;  Repeat   History reviewed. No pertinent family history. History  Substance Use Topics  . Smoking status: Never Smoker   . Smokeless tobacco: Never Used  . Alcohol Use: No   OB History    Gravida Para Term Preterm AB TAB SAB Ectopic Multiple Living   3 2 2  1  1   2      Review of Systems  All other systems reviewed and are negative.   Allergies  Tylenol  Home Medications   Prior to Admission medications   Medication Sig Start  Date End Date Taking? Authorizing Provider  amoxicillin-clavulanate (AUGMENTIN) 875-125 MG per tablet Take 1 tablet by mouth every 12 (twelve) hours. 11/09/14   Mathis FareJennifer Lee H Renee Erb, PA  ipratropium (ATROVENT) 0.06 % nasal spray Place 2 sprays into the nose 4 (four) times daily. 10/17/12   Linna HoffJames D Kindl, MD  Prenatal Vit-Fe Fum-FA-Omega (PRENATAL MULTI +DHA PO) Take 1 tablet by mouth at bedtime.      Historical Provider, MD   BP 111/75 mmHg  Pulse 110  Temp(Src) 98.6 F (37 C) (Oral)  Resp 14  SpO2 99%  LMP 11/08/2014 (Exact Date) Physical Exam  Constitutional: She is oriented to person, place, and time. She appears well-developed and well-nourished. No distress.  HENT:  Head: Normocephalic and atraumatic.  Eyes: Conjunctivae are normal.  Cardiovascular: Normal rate.   Pulmonary/Chest: Effort normal.  Musculoskeletal: Normal range of motion.  Neurological: She is alert and oriented to person, place, and time.  Skin: Skin is warm and dry.  Superficial abrasions of right foot & ankle and right thigh. Superficial abrasion of mid thoracic back Superficial puncture wound left lateral forearm and SQ depth puncture wound of left medial forearm  Psychiatric: She has a normal mood and affect. Her behavior is normal.  Nursing note and vitals reviewed.   ED Course  Procedures (including critical  care time) Labs Review Labs Reviewed - No data to display  Imaging Review No results found.   MDM   1. Animal bite of left forearm, initial encounter     Tetanus booster given at [facility]. Films negative for retained FB Wounds irrigated with sterile saline and dressed at [facility]. Advised to keep wound clean and dry and monitor closely for signs of infection. Only about 5% of dog bites become infected, however, if patient develop redness, increased swelling or purulent drainage from wound, instructed to begin taking Augmentin as prescribed and contact PCP or return to [facility] for re-evaluation.  Ria Clock, [place] 11/09/14 2124

## 2014-11-09 NOTE — ED Notes (Signed)
Reportedly tried to separate her husky mix and neighbors [place] when they were fighting earlier today. Sustained puncture wound x 2 to left forearm and abrasion to mid thoracic area. Her own dog is UTD on his shots and she thinks the neighbors dog is as well, and will check with them later. does not want to start rabies series unless she has to do so. Last tetanus is 5-6 years ago.

## 2015-02-25 ENCOUNTER — Other Ambulatory Visit: Payer: Self-pay

## 2015-02-25 DIAGNOSIS — Z1231 Encounter for screening mammogram for malignant neoplasm of breast: Secondary | ICD-10-CM

## 2015-03-29 ENCOUNTER — Ambulatory Visit
Admission: RE | Admit: 2015-03-29 | Discharge: 2015-03-29 | Disposition: A | Discharge status: Home / Self Care | Payer: 59 | Source: Ambulatory Visit

## 2015-03-29 DIAGNOSIS — Z1231 Encounter for screening mammogram for malignant neoplasm of breast: Secondary | ICD-10-CM

## 2015-09-01 DIAGNOSIS — H52223 Regular astigmatism, bilateral: Secondary | ICD-10-CM | POA: Diagnosis not present

## 2015-09-08 DIAGNOSIS — Z1321 Encounter for screening for nutritional disorder: Secondary | ICD-10-CM | POA: Diagnosis not present

## 2015-09-08 DIAGNOSIS — Z6822 Body mass index (BMI) 22.0-22.9, adult: Secondary | ICD-10-CM | POA: Diagnosis not present

## 2015-09-08 DIAGNOSIS — Z01419 Encounter for gynecological examination (general) (routine) without abnormal findings: Secondary | ICD-10-CM | POA: Diagnosis not present

## 2016-06-14 ENCOUNTER — Other Ambulatory Visit: Payer: Self-pay | Admitting: Obstetrics and Gynecology

## 2016-06-14 DIAGNOSIS — Z1231 Encounter for screening mammogram for malignant neoplasm of breast: Secondary | ICD-10-CM

## 2016-07-06 ENCOUNTER — Ambulatory Visit
Admission: RE | Admit: 2016-07-06 | Discharge: 2016-07-06 | Disposition: A | Discharge status: Home / Self Care | Payer: 59 | Source: Ambulatory Visit | Attending: Obstetrics and Gynecology | Admitting: Obstetrics and Gynecology

## 2016-07-06 DIAGNOSIS — Z1231 Encounter for screening mammogram for malignant neoplasm of breast: Secondary | ICD-10-CM | POA: Diagnosis not present

## 2016-09-14 DIAGNOSIS — Z6822 Body mass index (BMI) 22.0-22.9, adult: Secondary | ICD-10-CM | POA: Diagnosis not present

## 2016-09-14 DIAGNOSIS — Z01419 Encounter for gynecological examination (general) (routine) without abnormal findings: Secondary | ICD-10-CM | POA: Diagnosis not present

## 2016-10-31 DIAGNOSIS — H5213 Myopia, bilateral: Secondary | ICD-10-CM | POA: Diagnosis not present

## 2017-02-14 DIAGNOSIS — R3129 Other microscopic hematuria: Secondary | ICD-10-CM | POA: Diagnosis not present

## 2017-02-19 DIAGNOSIS — S70361A Insect bite (nonvenomous), right thigh, initial encounter: Secondary | ICD-10-CM | POA: Diagnosis not present

## 2017-09-24 DIAGNOSIS — Z01419 Encounter for gynecological examination (general) (routine) without abnormal findings: Secondary | ICD-10-CM | POA: Diagnosis not present

## 2017-09-24 DIAGNOSIS — Z6822 Body mass index (BMI) 22.0-22.9, adult: Secondary | ICD-10-CM | POA: Diagnosis not present

## 2017-10-05 DIAGNOSIS — Z0000 Encounter for general adult medical examination without abnormal findings: Secondary | ICD-10-CM | POA: Diagnosis not present

## 2017-10-05 DIAGNOSIS — R3129 Other microscopic hematuria: Secondary | ICD-10-CM | POA: Diagnosis not present

## 2017-10-05 DIAGNOSIS — Z131 Encounter for screening for diabetes mellitus: Secondary | ICD-10-CM | POA: Diagnosis not present

## 2017-10-05 DIAGNOSIS — E559 Vitamin D deficiency, unspecified: Secondary | ICD-10-CM | POA: Diagnosis not present

## 2017-10-05 DIAGNOSIS — Z136 Encounter for screening for cardiovascular disorders: Secondary | ICD-10-CM | POA: Diagnosis not present

## 2017-10-05 DIAGNOSIS — N921 Excessive and frequent menstruation with irregular cycle: Secondary | ICD-10-CM | POA: Diagnosis not present

## 2017-10-11 MED FILL — VIT D2 1.25 MG (50,000 UNIT: 1.25 MG | 28 days supply | Qty: 4 | Fill #0

## 2017-10-31 ENCOUNTER — Other Ambulatory Visit: Payer: Self-pay | Admitting: Obstetrics and Gynecology

## 2017-10-31 DIAGNOSIS — Z1231 Encounter for screening mammogram for malignant neoplasm of breast: Secondary | ICD-10-CM

## 2017-11-12 MED FILL — VIT D2 1.25 MG (50,000 UNIT: 1.25 MG | 28 days supply | Qty: 4 | Fill #1

## 2017-11-27 ENCOUNTER — Ambulatory Visit: Payer: 59

## 2017-12-17 MED FILL — VIT D2 1.25 MG (50,000 UNIT: 1.25 MG | 28 days supply | Qty: 4 | Fill #2

## 2017-12-25 ENCOUNTER — Ambulatory Visit
Admission: RE | Admit: 2017-12-25 | Discharge: 2017-12-25 | Disposition: A | Discharge status: Home / Self Care | Payer: 59 | Source: Ambulatory Visit | Attending: Obstetrics and Gynecology | Admitting: Obstetrics and Gynecology

## 2017-12-25 DIAGNOSIS — Z1231 Encounter for screening mammogram for malignant neoplasm of breast: Secondary | ICD-10-CM

## 2017-12-25 DIAGNOSIS — H5213 Myopia, bilateral: Secondary | ICD-10-CM | POA: Diagnosis not present

## 2018-02-19 MED FILL — VIT D2 1.25 MG (50,000 UNIT: 1.25 MG | 28 days supply | Qty: 4 | Fill #3

## 2018-04-09 DIAGNOSIS — E559 Vitamin D deficiency, unspecified: Secondary | ICD-10-CM | POA: Diagnosis not present

## 2018-10-08 DIAGNOSIS — N921 Excessive and frequent menstruation with irregular cycle: Secondary | ICD-10-CM | POA: Diagnosis not present

## 2018-10-08 DIAGNOSIS — Z131 Encounter for screening for diabetes mellitus: Secondary | ICD-10-CM | POA: Diagnosis not present

## 2018-10-08 DIAGNOSIS — E559 Vitamin D deficiency, unspecified: Secondary | ICD-10-CM | POA: Diagnosis not present

## 2018-10-08 DIAGNOSIS — Z13220 Encounter for screening for lipoid disorders: Secondary | ICD-10-CM | POA: Diagnosis not present

## 2018-10-08 DIAGNOSIS — Z0000 Encounter for general adult medical examination without abnormal findings: Secondary | ICD-10-CM | POA: Diagnosis not present

## 2018-10-30 DIAGNOSIS — Z01419 Encounter for gynecological examination (general) (routine) without abnormal findings: Secondary | ICD-10-CM | POA: Diagnosis not present

## 2018-10-30 DIAGNOSIS — Z6821 Body mass index (BMI) 21.0-21.9, adult: Secondary | ICD-10-CM | POA: Diagnosis not present

## 2018-10-30 DIAGNOSIS — N939 Abnormal uterine and vaginal bleeding, unspecified: Secondary | ICD-10-CM | POA: Diagnosis not present

## 2018-11-19 DIAGNOSIS — N939 Abnormal uterine and vaginal bleeding, unspecified: Secondary | ICD-10-CM | POA: Diagnosis not present

## 2018-11-19 DIAGNOSIS — N840 Polyp of corpus uteri: Secondary | ICD-10-CM | POA: Diagnosis not present

## 2019-02-04 DIAGNOSIS — E559 Vitamin D deficiency, unspecified: Secondary | ICD-10-CM | POA: Diagnosis not present

## 2019-02-05 DIAGNOSIS — H5213 Myopia, bilateral: Secondary | ICD-10-CM | POA: Diagnosis not present

## 2019-04-29 HISTORY — PX: HYSTEROSCOPY: SHX211

## 2019-04-30 DIAGNOSIS — R3121 Asymptomatic microscopic hematuria: Secondary | ICD-10-CM | POA: Diagnosis not present

## 2019-05-22 ENCOUNTER — Other Ambulatory Visit: Payer: Self-pay | Admitting: Obstetrics and Gynecology

## 2019-05-22 DIAGNOSIS — N840 Polyp of corpus uteri: Secondary | ICD-10-CM | POA: Diagnosis not present

## 2019-05-22 DIAGNOSIS — N939 Abnormal uterine and vaginal bleeding, unspecified: Secondary | ICD-10-CM | POA: Diagnosis not present

## 2019-05-22 DIAGNOSIS — N938 Other specified abnormal uterine and vaginal bleeding: Secondary | ICD-10-CM | POA: Diagnosis not present

## 2019-06-06 DIAGNOSIS — Z09 Encounter for follow-up examination after completed treatment for conditions other than malignant neoplasm: Secondary | ICD-10-CM | POA: Diagnosis not present

## 2019-06-06 DIAGNOSIS — Z23 Encounter for immunization: Secondary | ICD-10-CM | POA: Diagnosis not present

## 2019-08-12 ENCOUNTER — Other Ambulatory Visit: Payer: Self-pay | Admitting: Obstetrics and Gynecology

## 2019-08-12 DIAGNOSIS — Z1231 Encounter for screening mammogram for malignant neoplasm of breast: Secondary | ICD-10-CM

## 2019-08-13 ENCOUNTER — Ambulatory Visit
Admission: RE | Admit: 2019-08-13 | Discharge: 2019-08-13 | Disposition: A | Discharge status: Home / Self Care | Payer: 59 | Source: Ambulatory Visit

## 2019-08-13 ENCOUNTER — Other Ambulatory Visit: Payer: Self-pay

## 2019-08-13 DIAGNOSIS — Z1231 Encounter for screening mammogram for malignant neoplasm of breast: Secondary | ICD-10-CM | POA: Diagnosis not present

## 2019-09-04 DIAGNOSIS — H18211 Corneal edema secondary to contact lens, right eye: Secondary | ICD-10-CM | POA: Diagnosis not present

## 2019-09-04 MED FILL — PREDNISOLONE AC 1% EYE DROP: 1 | 13 days supply | Qty: 5 | Fill #0

## 2019-10-14 DIAGNOSIS — E559 Vitamin D deficiency, unspecified: Secondary | ICD-10-CM | POA: Diagnosis not present

## 2019-10-14 DIAGNOSIS — Z13220 Encounter for screening for lipoid disorders: Secondary | ICD-10-CM | POA: Diagnosis not present

## 2019-10-14 DIAGNOSIS — Z0000 Encounter for general adult medical examination without abnormal findings: Secondary | ICD-10-CM | POA: Diagnosis not present

## 2019-10-14 DIAGNOSIS — N921 Excessive and frequent menstruation with irregular cycle: Secondary | ICD-10-CM | POA: Diagnosis not present

## 2019-11-21 ENCOUNTER — Other Ambulatory Visit: Payer: 59

## 2019-12-02 DIAGNOSIS — Z6822 Body mass index (BMI) 22.0-22.9, adult: Secondary | ICD-10-CM | POA: Diagnosis not present

## 2019-12-02 DIAGNOSIS — R319 Hematuria, unspecified: Secondary | ICD-10-CM | POA: Diagnosis not present

## 2019-12-02 DIAGNOSIS — Z01419 Encounter for gynecological examination (general) (routine) without abnormal findings: Secondary | ICD-10-CM | POA: Diagnosis not present

## 2020-03-04 DIAGNOSIS — H5213 Myopia, bilateral: Secondary | ICD-10-CM | POA: Diagnosis not present

## 2020-06-04 DIAGNOSIS — Z20828 Contact with and (suspected) exposure to other viral communicable diseases: Secondary | ICD-10-CM | POA: Diagnosis not present

## 2020-10-19 DIAGNOSIS — Z8619 Personal history of other infectious and parasitic diseases: Secondary | ICD-10-CM | POA: Diagnosis not present

## 2020-10-19 DIAGNOSIS — E559 Vitamin D deficiency, unspecified: Secondary | ICD-10-CM | POA: Diagnosis not present

## 2020-10-19 DIAGNOSIS — Z13220 Encounter for screening for lipoid disorders: Secondary | ICD-10-CM | POA: Diagnosis not present

## 2020-10-19 DIAGNOSIS — Z0000 Encounter for general adult medical examination without abnormal findings: Secondary | ICD-10-CM | POA: Diagnosis not present

## 2020-10-19 DIAGNOSIS — Z131 Encounter for screening for diabetes mellitus: Secondary | ICD-10-CM | POA: Diagnosis not present

## 2020-11-05 ENCOUNTER — Encounter: Payer: Self-pay | Admitting: Internal Medicine

## 2021-01-06 ENCOUNTER — Other Ambulatory Visit (HOSPITAL_COMMUNITY): Payer: Self-pay

## 2021-01-06 ENCOUNTER — Other Ambulatory Visit: Payer: Self-pay

## 2021-01-06 ENCOUNTER — Ambulatory Visit (AMBULATORY_SURGERY_CENTER): Payer: 59

## 2021-01-06 VITALS — Ht 61.0 in | Wt 123.0 lb

## 2021-01-06 DIAGNOSIS — Z1211 Encounter for screening for malignant neoplasm of colon: Secondary | ICD-10-CM

## 2021-01-06 MED ORDER — NA SULFATE-K SULFATE-MG SULF 17.5-3.13-1.6 GM/177ML PO SOLN
1.0000 | Freq: Once | ORAL | 0 refills | Status: AC
  Filled 2021-01-06: qty 354, 1d supply, fill #0

## 2021-01-06 NOTE — Progress Notes (Signed)
VIRTUAL PV  No egg or soy allergy known to patient  No issues with past sedation with any surgeries or procedures Patient denies ever being told they had issues or difficulty with intubation  No FH of Malignant Hyperthermia No diet pills per patient No home 02 use per patient  No blood thinners per patient  Pt denies issues with constipation  No A fib or A flutter  EMMI video to pt or via MyChart  COVID 19 guidelines implemented in PV today with Pt and RN   Due to the COVID-19 pandemic we are asking patients to follow certain guidelines.  Pt aware of COVID protocols and [facility] guidelines   

## 2021-01-13 ENCOUNTER — Other Ambulatory Visit (HOSPITAL_COMMUNITY): Payer: Self-pay

## 2021-01-17 ENCOUNTER — Encounter: Payer: Self-pay | Admitting: Internal Medicine

## 2021-01-17 DIAGNOSIS — Z01419 Encounter for gynecological examination (general) (routine) without abnormal findings: Secondary | ICD-10-CM | POA: Diagnosis not present

## 2021-01-17 DIAGNOSIS — Z1231 Encounter for screening mammogram for malignant neoplasm of breast: Secondary | ICD-10-CM | POA: Diagnosis not present

## 2021-01-17 DIAGNOSIS — Z6822 Body mass index (BMI) 22.0-22.9, adult: Secondary | ICD-10-CM | POA: Diagnosis not present

## 2021-01-21 ENCOUNTER — Other Ambulatory Visit: Payer: Self-pay

## 2021-01-21 ENCOUNTER — Encounter: Payer: Self-pay | Admitting: Internal Medicine

## 2021-01-21 ENCOUNTER — Ambulatory Visit (AMBULATORY_SURGERY_CENTER): Payer: 59 | Admitting: Internal Medicine

## 2021-01-21 VITALS — BP 97/54 | HR 75 | Temp 98.0°F | Resp 11 | Ht 61.0 in | Wt 123.0 lb

## 2021-01-21 DIAGNOSIS — Z1211 Encounter for screening for malignant neoplasm of colon: Secondary | ICD-10-CM | POA: Diagnosis not present

## 2021-01-21 MED ORDER — SODIUM CHLORIDE 0.9 % IV SOLN: 500.0000 mL | Freq: Once | INTRAVENOUS | Status: DC

## 2021-01-21 NOTE — Progress Notes (Signed)
Pt's states no medical or surgical changes since previsit or office visit.  VS taken by SM 

## 2021-01-21 NOTE — Patient Instructions (Addendum)
Colonoscopy was normal.  Next routine colonoscopy or other screening test in 10 years - 2032.  I appreciate the opportunity to care for you. Iva Boop, MD, FACG  YOU HAD AN ENDOSCOPIC PROCEDURE TODAY AT THE [facility]:   Refer to the procedure report that was given to you for any specific questions about what was found during the examination.  If the procedure report does not answer your questions, please call your gastroenterologist to clarify.  If you requested that your care partner not be given the details of your procedure findings, then the procedure report has been included in a sealed envelope for you to review at your convenience later.  YOU SHOULD EXPECT: Some feelings of bloating in the abdomen. Passage of more gas than usual.  Walking can help get rid of the air that was put into your GI tract during the procedure and reduce the bloating. If you had a lower endoscopy (such as a colonoscopy or flexible sigmoidoscopy) you may notice spotting of blood in your stool or on the toilet paper. If you underwent a bowel prep for your procedure, you may not have a normal bowel movement for a few days.  Please Note:  You might notice some irritation and congestion in your nose or some drainage.  This is from the oxygen used during your procedure.  There is no need for concern and it should clear up in a day or so.  SYMPTOMS TO REPORT IMMEDIATELY:   Following lower endoscopy (colonoscopy or flexible sigmoidoscopy):  Excessive amounts of blood in the stool  Significant tenderness or worsening of abdominal pains  Swelling of the abdomen that is new, acute  Fever of 100F or higher  For urgent or emergent issues, a gastroenterologist can be reached at any hour by calling [phone]. Do not use MyChart messaging for urgent concerns.    DIET:  We do recommend a small meal at first, but then you may proceed to your regular diet.  Drink plenty of fluids but you should avoid  alcoholic beverages for 24 hours.  ACTIVITY:  You should plan to take it easy for the rest of today and you should NOT DRIVE or use heavy machinery until tomorrow (because of the sedation medicines used during the test).    FOLLOW UP: Our staff will call the number listed on your records 48-72 hours following your procedure to check on you and address any questions or concerns that you may have regarding the information given to you following your procedure. If we do not reach you, we will leave a message.  We will attempt to reach you two times.  During this call, we will ask if you have developed any symptoms of COVID 19. If you develop any symptoms (ie: fever, flu-like symptoms, shortness of breath, cough etc.) before then, please call [phone].  If you test positive for Covid 19 in the 2 weeks post procedure, please call and report this information to [place].    If any biopsies were taken you will be contacted by phone or by letter within the next 1-3 weeks.  Please call us at [phone] if you have not heard about the biopsies in 3 weeks.    SIGNATURES/CONFIDENTIALITY: You and/or your care partner have signed paperwork which will be entered into your electronic medical record.  These signatures attest to the fact that that the information above on your After Visit Summary has been reviewed and is understood.  Full responsibility of  the confidentiality of this discharge information lies with you and/or your care-partner. 

## 2021-01-21 NOTE — Progress Notes (Signed)
pt tolerated well. VSS. awake and to recovery. Report given to RN.  

## 2021-01-21 NOTE — Op Note (Signed)
[facility] Patient Name: Michele Medina Procedure Date: 01/21/2021 8:13 AM MRN: [id number] Endoscopist: Iva Boop , MD Age: 45 Referring MD:  Date of Birth: 1976-01-30 Gender: Female Account #: [id number] Procedure:                Colonoscopy Indications:              Screening for colorectal malignant neoplasm, This                            is the patient's first colonoscopy Medicines:                Propofol per Anesthesia, Monitored Anesthesia Care Procedure:                Pre-Anesthesia Assessment:                           - Prior to the procedure, a History and Physical                            was performed, and patient medications and                            allergies were reviewed. The patient's tolerance of                            previous anesthesia was also reviewed. The risks                            and benefits of the procedure and the sedation                            options and risks were discussed with the patient.                            All questions were answered, and informed consent                            was obtained. Prior Anticoagulants: The patient has                            taken no previous anticoagulant or antiplatelet                            agents. ASA Grade Assessment: II - A patient with                            mild systemic disease. After reviewing the risks                            and benefits, the patient was deemed in                            satisfactory condition to undergo the procedure.  After obtaining informed consent, the colonoscope                            was passed under direct vision. Throughout the                            procedure, the patient's blood pressure, pulse, and                            oxygen saturations were monitored continuously. The                            Olympus PCF-H190DL [id number]) Colonoscope was                             introduced through the anus and advanced to the the                            cecum, identified by appendiceal orifice and                            ileocecal valve. The colonoscopy was performed                            without difficulty. The patient tolerated the                            procedure well. The quality of the bowel                            preparation was excellent. The ileocecal valve,                            appendiceal orifice, and rectum were photographed.                            The bowel preparation used was Miralax and SUPREP                            via split dose instruction. Scope In: 8:25:27 AM Scope Out: 8:35:57 AM Scope Withdrawal Time: 0 hours 7 minutes 29 seconds  Total Procedure Duration: 0 hours 10 minutes 30 seconds  Findings:                 The perianal and digital rectal examinations were                            normal.                           The entire examined colon appeared normal on direct                            and retroflexion views. Complications:            No immediate complications.  Estimated Blood Loss:     Estimated blood loss: none. Impression:               - The entire examined colon is normal on direct and                            retroflexion views.                           - No specimens collected. Recommendation:           - Patient has a contact number available for                            emergencies. The signs and symptoms of potential                            delayed complications were discussed with the                            patient. Return to normal activities tomorrow.                            Written discharge instructions were provided to the                            patient.                           - Resume previous diet.                           - Continue present medications.                           - Repeat colonoscopy in 10 years for screening                             purposes. Iva Boop, MD 01/21/2021 8:39:37 AM This report has been signed electronically.

## 2021-01-25 ENCOUNTER — Telehealth: Payer: Self-pay | Admitting: *Deleted

## 2021-01-25 NOTE — Telephone Encounter (Signed)
  Follow up Call-  Call back number 01/21/2021  Post procedure Call Back phone  # [phone]  Permission to leave phone message Yes  Some recent data might be hidden     First attempt for follow up phone call. No answer at number given.  Left message on voicemail.

## 2021-01-25 NOTE — Telephone Encounter (Signed)
  Follow up Call-  Call back number 01/21/2021  Post procedure Call Back phone  # [phone]  Permission to leave phone message Yes  Some recent data might be hidden     Patient questions:  Do you have a fever, pain , or abdominal swelling? No. Pain Score  0 *  Have you tolerated food without any problems? Yes.    Have you been able to return to your normal activities? Yes.    Do you have any questions about your discharge instructions: Diet   No. Medications  No. Follow up visit  No.  Do you have questions or concerns about your Care? No.  Actions: * If pain score is 4 or above: No action needed, pain <4.  1. Have you developed a fever since your procedure? no  2.   Have you had an respiratory symptoms (SOB or cough) since your procedure? no  3.   Have you tested positive for COVID 19 since your procedure no  4.   Have you had any family members/close contacts diagnosed with the COVID 19 since your procedure?  no   If yes to any of these questions please route to Laverna Peace, RN and Karlton Lemon, RN

## 2021-04-01 DIAGNOSIS — H5213 Myopia, bilateral: Secondary | ICD-10-CM | POA: Diagnosis not present

## 2021-08-04 DIAGNOSIS — K1120 Sialoadenitis, unspecified: Secondary | ICD-10-CM | POA: Diagnosis not present

## 2021-11-21 DIAGNOSIS — E559 Vitamin D deficiency, unspecified: Secondary | ICD-10-CM | POA: Diagnosis not present

## 2021-11-21 DIAGNOSIS — N921 Excessive and frequent menstruation with irregular cycle: Secondary | ICD-10-CM | POA: Diagnosis not present

## 2021-11-21 DIAGNOSIS — Z0000 Encounter for general adult medical examination without abnormal findings: Secondary | ICD-10-CM | POA: Diagnosis not present

## 2022-02-09 ENCOUNTER — Ambulatory Visit (INDEPENDENT_AMBULATORY_CARE_PROVIDER_SITE_OTHER): Payer: 59

## 2022-02-09 ENCOUNTER — Encounter: Payer: Self-pay | Admitting: Orthopaedic Surgery

## 2022-02-09 ENCOUNTER — Ambulatory Visit (INDEPENDENT_AMBULATORY_CARE_PROVIDER_SITE_OTHER): Payer: 59 | Admitting: Orthopaedic Surgery

## 2022-02-09 DIAGNOSIS — S92501A Displaced unspecified fracture of right lesser toe(s), initial encounter for closed fracture: Secondary | ICD-10-CM | POA: Diagnosis not present

## 2022-02-09 DIAGNOSIS — M79674 Pain in right toe(s): Secondary | ICD-10-CM

## 2022-02-09 NOTE — Progress Notes (Signed)
Sahalie comes in today for assessment of an injury to her right foot third toe.  The corner of her laptop dropped directly on that toe and she had significant pain.  It did throb most of the evening and she came in today for further ration treatment.  She is a Advice worker.  She denies any numbness and tingling but it was certainly throbbing pain.  The toe clinically is intact in terms of alignment.  The nail is intact as well.  There is significant bruising and some swelling.  Several x-rays of the right third toe show a distal phalanx tuft type of fracture.  The DIP joint is congruent and well-maintained.  The fracture is a stable fracture.  We can treat this with weightbearing as tolerated in a postop shoe and transition to regular shoes that she is comfortable.  She will refrain from any running or high impact aerobic activities while this takes his time to heal.  We can see her back in 4 weeks with a repeat 3 views of the right third toe.  All question concerns were answered and addressed.  I do not feel that the nail is an issue and should grow out normally.  If things worsen she will let us know.

## 2022-02-14 DIAGNOSIS — Z1151 Encounter for screening for human papillomavirus (HPV): Secondary | ICD-10-CM | POA: Diagnosis not present

## 2022-02-14 DIAGNOSIS — Z01419 Encounter for gynecological examination (general) (routine) without abnormal findings: Secondary | ICD-10-CM | POA: Diagnosis not present

## 2022-02-14 DIAGNOSIS — Z6823 Body mass index (BMI) 23.0-23.9, adult: Secondary | ICD-10-CM | POA: Diagnosis not present

## 2022-02-14 DIAGNOSIS — Z124 Encounter for screening for malignant neoplasm of cervix: Secondary | ICD-10-CM | POA: Diagnosis not present

## 2022-03-08 ENCOUNTER — Ambulatory Visit (INDEPENDENT_AMBULATORY_CARE_PROVIDER_SITE_OTHER): Payer: 59 | Admitting: Orthopaedic Surgery

## 2022-03-08 ENCOUNTER — Ambulatory Visit (INDEPENDENT_AMBULATORY_CARE_PROVIDER_SITE_OTHER): Payer: 59

## 2022-03-08 ENCOUNTER — Encounter: Payer: Self-pay | Admitting: Orthopaedic Surgery

## 2022-03-08 DIAGNOSIS — M79674 Pain in right toe(s): Secondary | ICD-10-CM

## 2022-03-08 NOTE — Progress Notes (Signed)
Michele Medina is about a month out from an injury to her right foot third toe.  This was a tuft fracture.  Her nail is still intact and she is feeling much better than she was before.  She is walking with regular shoes now.  Examination of her right foot third today shows that the nail is still intact.  I do feel that this cannot eventually push itself out and she should just keep trimming it about but overall there is no evidence of infection and the swelling is minimal.  There is no drainage.  X-rays of the right foot third toe show a comminuted fracture with no displacement.  The IP joint is well located.  There is been potentially slight interval healing.  From my standpoint, follow-up can be as needed since we do not really need to keep x-ray and this will just go on a clinical exam aspect of things.  If she does develop any issues with the toenail she can let us know.  She will continue to increase her activities as comfort allows.  She is going to the beach next week so she can get this wet in salt water.

## 2022-07-06 DIAGNOSIS — H5213 Myopia, bilateral: Secondary | ICD-10-CM | POA: Diagnosis not present

## 2022-08-29 ENCOUNTER — Other Ambulatory Visit: Payer: Self-pay | Admitting: Obstetrics and Gynecology

## 2022-08-29 DIAGNOSIS — Z1231 Encounter for screening mammogram for malignant neoplasm of breast: Secondary | ICD-10-CM

## 2022-10-24 ENCOUNTER — Ambulatory Visit
Admission: RE | Admit: 2022-10-24 | Discharge: 2022-10-24 | Disposition: A | Discharge status: Home / Self Care | Payer: 59 | Source: Ambulatory Visit | Attending: Obstetrics and Gynecology | Admitting: Obstetrics and Gynecology

## 2022-10-24 DIAGNOSIS — Z1231 Encounter for screening mammogram for malignant neoplasm of breast: Secondary | ICD-10-CM | POA: Diagnosis not present

## 2022-11-28 DIAGNOSIS — R42 Dizziness and giddiness: Secondary | ICD-10-CM | POA: Diagnosis not present

## 2022-11-28 DIAGNOSIS — Z1211 Encounter for screening for malignant neoplasm of colon: Secondary | ICD-10-CM | POA: Diagnosis not present

## 2022-11-28 DIAGNOSIS — E559 Vitamin D deficiency, unspecified: Secondary | ICD-10-CM | POA: Diagnosis not present

## 2022-11-28 DIAGNOSIS — Z0000 Encounter for general adult medical examination without abnormal findings: Secondary | ICD-10-CM | POA: Diagnosis not present

## 2022-11-28 DIAGNOSIS — Z131 Encounter for screening for diabetes mellitus: Secondary | ICD-10-CM | POA: Diagnosis not present

## 2022-11-28 DIAGNOSIS — Z1239 Encounter for other screening for malignant neoplasm of breast: Secondary | ICD-10-CM | POA: Diagnosis not present

## 2022-11-28 DIAGNOSIS — Z13220 Encounter for screening for lipoid disorders: Secondary | ICD-10-CM | POA: Diagnosis not present

## 2023-03-06 ENCOUNTER — Other Ambulatory Visit (HOSPITAL_COMMUNITY): Payer: Self-pay

## 2023-03-06 DIAGNOSIS — N926 Irregular menstruation, unspecified: Secondary | ICD-10-CM | POA: Diagnosis not present

## 2023-03-06 DIAGNOSIS — Z01419 Encounter for gynecological examination (general) (routine) without abnormal findings: Secondary | ICD-10-CM | POA: Diagnosis not present

## 2023-03-06 DIAGNOSIS — Z6823 Body mass index (BMI) 23.0-23.9, adult: Secondary | ICD-10-CM | POA: Diagnosis not present

## 2023-03-06 MED ORDER — MEDROXYPROGESTERONE ACETATE 10 MG PO TABS
10.0000 mg | ORAL_TABLET | Freq: Every day | ORAL | 8 refills | Status: AC
  Filled 2023-03-06: qty 10, 10d supply, fill #0

## 2023-05-03 DIAGNOSIS — N6459 Other signs and symptoms in breast: Secondary | ICD-10-CM | POA: Diagnosis not present

## 2023-05-07 ENCOUNTER — Other Ambulatory Visit: Payer: Self-pay | Admitting: Obstetrics and Gynecology

## 2023-05-07 DIAGNOSIS — N6452 Nipple discharge: Secondary | ICD-10-CM

## 2023-05-16 ENCOUNTER — Ambulatory Visit
Admission: RE | Admit: 2023-05-16 | Discharge: 2023-05-16 | Disposition: A | Discharge status: Home / Self Care | Payer: 59 | Source: Ambulatory Visit | Attending: Obstetrics and Gynecology | Admitting: Obstetrics and Gynecology

## 2023-05-16 DIAGNOSIS — N6452 Nipple discharge: Secondary | ICD-10-CM | POA: Diagnosis not present

## 2023-06-07 ENCOUNTER — Other Ambulatory Visit: Payer: Self-pay | Admitting: Obstetrics and Gynecology

## 2023-06-07 DIAGNOSIS — N6452 Nipple discharge: Secondary | ICD-10-CM

## 2023-08-23 ENCOUNTER — Ambulatory Visit
Admission: RE | Admit: 2023-08-23 | Discharge: 2023-08-23 | Disposition: A | Discharge status: Home / Self Care | Payer: 59 | Source: Ambulatory Visit | Attending: Obstetrics and Gynecology | Admitting: Obstetrics and Gynecology

## 2023-08-23 DIAGNOSIS — N6452 Nipple discharge: Secondary | ICD-10-CM

## 2023-08-23 MED ORDER — GADOPICLENOL 0.5 MMOL/ML IV SOLN
5.0000 mL | Freq: Once | INTRAVENOUS | Status: AC | PRN
  Administered 2023-08-23: 5 mL via INTRAVENOUS

## 2023-09-06 DIAGNOSIS — N6452 Nipple discharge: Secondary | ICD-10-CM | POA: Diagnosis not present

## 2023-09-18 ENCOUNTER — Other Ambulatory Visit (HOSPITAL_COMMUNITY): Payer: Self-pay

## 2023-09-18 ENCOUNTER — Encounter: Payer: Self-pay | Admitting: Pharmacist

## 2023-09-18 DIAGNOSIS — B354 Tinea corporis: Secondary | ICD-10-CM | POA: Diagnosis not present

## 2023-09-18 MED ORDER — CICLOPIROX OLAMINE 0.77 % EX CREA
1.0000 | TOPICAL_CREAM | Freq: Two times a day (BID) | CUTANEOUS | 1 refills | Status: AC
  Filled 2023-09-18: qty 30, 30d supply, fill #0
  Filled 2024-03-07: qty 30, 30d supply, fill #1

## 2023-09-19 ENCOUNTER — Other Ambulatory Visit (HOSPITAL_COMMUNITY): Payer: Self-pay

## 2023-10-03 ENCOUNTER — Other Ambulatory Visit (HOSPITAL_COMMUNITY): Payer: Self-pay

## 2023-10-03 MED ORDER — CLOTRIMAZOLE-BETAMETHASONE 1-0.05 % EX CREA
1.0000 | TOPICAL_CREAM | Freq: Two times a day (BID) | CUTANEOUS | 0 refills | Status: AC
  Filled 2023-10-03: qty 45, 15d supply, fill #0

## 2023-10-10 ENCOUNTER — Other Ambulatory Visit: Payer: Self-pay | Admitting: General Surgery

## 2023-10-10 DIAGNOSIS — N6452 Nipple discharge: Secondary | ICD-10-CM

## 2023-11-06 DIAGNOSIS — B354 Tinea corporis: Secondary | ICD-10-CM | POA: Diagnosis not present

## 2023-11-06 DIAGNOSIS — L300 Nummular dermatitis: Secondary | ICD-10-CM | POA: Diagnosis not present

## 2023-11-20 ENCOUNTER — Ambulatory Visit
Admission: RE | Admit: 2023-11-20 | Discharge: 2023-11-20 | Disposition: A | Discharge status: Home / Self Care | Payer: 59 | Source: Ambulatory Visit | Attending: General Surgery | Admitting: General Surgery

## 2023-11-20 ENCOUNTER — Ambulatory Visit: Payer: 59

## 2023-11-20 DIAGNOSIS — N6452 Nipple discharge: Secondary | ICD-10-CM | POA: Diagnosis not present

## 2023-11-29 DIAGNOSIS — Z0189 Encounter for other specified special examinations: Secondary | ICD-10-CM | POA: Diagnosis not present

## 2023-11-29 DIAGNOSIS — E559 Vitamin D deficiency, unspecified: Secondary | ICD-10-CM | POA: Diagnosis not present

## 2023-11-29 DIAGNOSIS — Z130 Encounter for screening for diseases of the blood and blood-forming organs and certain disorders involving the immune mechanism: Secondary | ICD-10-CM | POA: Diagnosis not present

## 2023-11-29 DIAGNOSIS — Z131 Encounter for screening for diabetes mellitus: Secondary | ICD-10-CM | POA: Diagnosis not present

## 2023-11-29 DIAGNOSIS — Z13220 Encounter for screening for lipoid disorders: Secondary | ICD-10-CM | POA: Diagnosis not present

## 2024-01-03 ENCOUNTER — Other Ambulatory Visit (HOSPITAL_COMMUNITY): Payer: Self-pay

## 2024-01-03 ENCOUNTER — Encounter: Payer: Self-pay | Admitting: Dermatology

## 2024-01-03 ENCOUNTER — Ambulatory Visit: Payer: 59 | Admitting: Dermatology

## 2024-01-03 VITALS — BP 129/85 | HR 83

## 2024-01-03 DIAGNOSIS — L708 Other acne: Secondary | ICD-10-CM

## 2024-01-03 DIAGNOSIS — L680 Hirsutism: Secondary | ICD-10-CM

## 2024-01-03 DIAGNOSIS — L700 Acne vulgaris: Secondary | ICD-10-CM

## 2024-01-03 MED ORDER — ALTRENO 0.05 % EX LOTN: 1.0000 | TOPICAL_LOTION | Freq: Every evening | CUTANEOUS | 3 refills | Status: AC

## 2024-01-03 MED ORDER — CLINDAMYCIN PHOSPHATE 1 % EX SWAB
1.0000 | Freq: Every morning | CUTANEOUS | 3 refills | Status: DC
  Filled 2024-01-03: qty 60, 60d supply, fill #0
  Filled 2024-03-07: qty 60, 60d supply, fill #1
  Filled 2024-05-19: qty 60, 60d supply, fill #2
  Filled 2024-07-21: qty 60, 60d supply, fill #3

## 2024-01-03 NOTE — Progress Notes (Signed)
   New Patient Visit   Subjective  Michele Medina is a 48 y.o. female who presents for the following: Acne  Patient states she has acne located at the face that she would like to have examined. Patient reports the areas have been there for 2 years. She reports the areas are bothersome.Patient rates irritation 7 out of 10.   Michele Medina presents with adult-onset acne that has worsened during perimenopause. She reports acne primarily on her chin, jawline, nose, and forehead. The patient admits to picking at the swollen acne, which exacerbates the condition. She has been using over-the-counter CeraVe products but has not tried any prescription medications for her acne.  Ms. Michele Medina does not have a consistent skin care regimen and has used astringents, which she acknowledges can be drying. She mentions increased hair growth as an additional concern. The patient reports a history of a benign heart murmur. She has not been using any prescription treatments for her skin concerns and has not established a regular skincare routine.     The following portions of the chart were reviewed this encounter and updated as appropriate: medications, allergies, medical history  Review of Systems:  No other skin or systemic complaints except as noted in HPI or Assessment and Plan.  Objective  Well appearing patient in no apparent distress; mood and affect are within normal limits.  A focused examination was performed of the following areas: Face  Relevant exam findings are noted in the Assessment and Plan.        Assessment & Plan    1. Adult-onset acne - Assessment: Worsening adult-onset acne during perimenopause, primarily located on the chin, jawline, nose, and forehead, consistent with hormonal acne. Patient reports picking at swollen acne. Previous treatment includes over-the-counter CeraVe products without prescription medications. Patient lacks a consistent skin care regimen and has used  potentially drying astringents.  - Plan:    Initiate topical regimen:     - Clindamycin  swabs every morning     - Altreno  twice weekly at night for the first month, increasing to three times weekly if no irritation occurs    Prescribe La Roche-Posay gel cleanser and Double Repair Tolerane moisturizer with sunscreen for daytime use    Consider oral spironolactone 50 mg daily, with potential increase to 100 mg if needed     - Informed consent: May cause lightheadedness or dizziness; dose can be adjusted or discontinued if side effects occur    Send prescriptions to [facility] pharmacy (specialized in dermatology)    Follow up in 3-4 months to assess progress and perform skin cancer screening    If needed, consider adding spironolactone or using doxycycline for flare-ups  2. Hirsutism - Assessment: Patient reports increased hair growth, possibly related to hormonal changes associated with perimenopause. - Plan: Monitor response to spironolactone, which may help with both acne and hair growth     Return in about 3 months (around 04/04/2024) for Acne and TBSE.  Exie Holler, CMA, am acting as scribe for [employer], DO.   Documentation: I have reviewed the above documentation for accuracy and completeness, and I agree with the above.  Louana Roup, DO

## 2024-01-03 NOTE — Patient Instructions (Addendum)
 Date: Thu Jan 03 2024  Hello Michele Medina,  Thank you for visiting today. Here is a summary of the key instructions:  Medications: - Use clindamycin swabs every morning - Apply Altreno twice a week at night for the first month   - Increase to three times a week if no irritation occurs  Skin Care Routine: - Use La Roche-Posay gel cleanser for washing - Apply Double Repair Tolerane moisturizer - Use moisturizer with sunscreen during the day  Lifestyle Changes: - Avoid picking at acne - Stay out of the sun  Follow-up: - Return for a follow-up appointment in 3-4 months - Skin cancer screening will be done during the follow-up visit  Additional Instructions: - Contact the office through MyChart for quick responses   Please reach out if you have any questions or concerns.  Warm regards,  Dr. Louana Roup [facility]    Important Information  Due to recent changes in healthcare laws, you may see results of your pathology and/or laboratory studies on MyChart before the doctors have had a chance to review them. We understand that in some cases there may be results that are confusing or concerning to you. Please understand that not all results are received at the same time and often the doctors may need to interpret multiple results in order to provide you with the best plan of care or course of treatment. Therefore, we ask that you please give us  2 business days to thoroughly review all your results before contacting the office for clarification. Should we see a critical lab result, you will be contacted sooner.   If You Need Anything After Your Visit  If you have any questions or concerns for your doctor, please call our main line at [phone] If no one answers, please leave a voicemail as directed and we will return your call as soon as possible. Messages left after 4 pm will be answered the following business day.   You may also send us  a message via MyChart. We typically  respond to MyChart messages within 1-2 business days.  For prescription refills, please ask your pharmacy to contact our office. Our fax number is [phone].  If you have an urgent issue when the clinic is closed that cannot wait until the next business day, you can page your doctor at the number below.    Please note that while we do our best to be available for urgent issues outside of office hours, we are not available 24/7.   If you have an urgent issue and are unable to reach us , you may choose to seek medical care at your doctor's office, retail clinic, urgent care center, or emergency room.  If you have a medical emergency, please immediately call 911 or go to the emergency department. In the event of inclement weather, please call our main line at [phone] for an update on the status of any delays or closures.  Dermatology Medication Tips: Please keep the boxes that topical medications come in in order to help keep track of the instructions about where and how to use these. Pharmacies typically print the medication instructions only on the boxes and not directly on the medication tubes.   If your medication is too expensive, please contact our office at [phone] or send us  a message through MyChart.   We are unable to tell what your co-pay for medications will be in advance as this is different depending on your insurance coverage. However, we may be able to find a  substitute medication at lower cost or fill out paperwork to get insurance to cover a needed medication.   If a prior authorization is required to get your medication covered by your insurance company, please allow us  1-2 business days to complete this process.  Drug prices often vary depending on where the prescription is filled and some pharmacies may offer cheaper prices.  The website www.goodrx.com contains coupons for medications through different pharmacies. The prices here do not account for what the cost  may be with help from insurance (it may be cheaper with your insurance), but the website can give you the price if you did not use any insurance.  - You can print the associated coupon and take it with your prescription to the pharmacy.  - You may also stop by our office during regular business hours and pick up a GoodRx coupon card.  - If you need your prescription sent electronically to a different pharmacy, notify our office through [facility] or by phone at [phone]

## 2024-01-04 ENCOUNTER — Other Ambulatory Visit (HOSPITAL_COMMUNITY): Payer: Self-pay

## 2024-02-09 DIAGNOSIS — R300 Dysuria: Secondary | ICD-10-CM | POA: Diagnosis not present

## 2024-02-09 DIAGNOSIS — R350 Frequency of micturition: Secondary | ICD-10-CM | POA: Diagnosis not present

## 2024-02-09 DIAGNOSIS — R3129 Other microscopic hematuria: Secondary | ICD-10-CM | POA: Diagnosis not present

## 2024-03-13 ENCOUNTER — Other Ambulatory Visit (HOSPITAL_COMMUNITY): Payer: Self-pay

## 2024-03-19 ENCOUNTER — Other Ambulatory Visit (HOSPITAL_COMMUNITY): Payer: Self-pay

## 2024-03-19 DIAGNOSIS — N926 Irregular menstruation, unspecified: Secondary | ICD-10-CM | POA: Diagnosis not present

## 2024-03-19 DIAGNOSIS — Z124 Encounter for screening for malignant neoplasm of cervix: Secondary | ICD-10-CM | POA: Diagnosis not present

## 2024-03-19 DIAGNOSIS — Z01419 Encounter for gynecological examination (general) (routine) without abnormal findings: Secondary | ICD-10-CM | POA: Diagnosis not present

## 2024-03-19 DIAGNOSIS — Z6823 Body mass index (BMI) 23.0-23.9, adult: Secondary | ICD-10-CM | POA: Diagnosis not present

## 2024-03-19 MED ORDER — MEDROXYPROGESTERONE ACETATE 10 MG PO TABS
10.0000 mg | ORAL_TABLET | Freq: Every day | ORAL | 8 refills | Status: AC | PRN
  Filled 2024-03-19: qty 10, 10d supply, fill #0

## 2024-03-20 ENCOUNTER — Other Ambulatory Visit (HOSPITAL_COMMUNITY): Payer: Self-pay

## 2024-03-31 ENCOUNTER — Other Ambulatory Visit (HOSPITAL_COMMUNITY): Payer: Self-pay

## 2024-04-08 DIAGNOSIS — R87610 Atypical squamous cells of undetermined significance on cytologic smear of cervix (ASC-US): Secondary | ICD-10-CM | POA: Diagnosis not present

## 2024-04-08 DIAGNOSIS — R87619 Unspecified abnormal cytological findings in specimens from cervix uteri: Secondary | ICD-10-CM | POA: Diagnosis not present

## 2024-05-12 ENCOUNTER — Ambulatory Visit (INDEPENDENT_AMBULATORY_CARE_PROVIDER_SITE_OTHER): Admitting: Dermatology

## 2024-05-12 VITALS — BP 127/83

## 2024-05-12 DIAGNOSIS — Z1283 Encounter for screening for malignant neoplasm of skin: Secondary | ICD-10-CM | POA: Diagnosis not present

## 2024-05-12 DIAGNOSIS — L814 Other melanin hyperpigmentation: Secondary | ICD-10-CM

## 2024-05-12 DIAGNOSIS — D1801 Hemangioma of skin and subcutaneous tissue: Secondary | ICD-10-CM

## 2024-05-12 DIAGNOSIS — W908XXA Exposure to other nonionizing radiation, initial encounter: Secondary | ICD-10-CM

## 2024-05-12 DIAGNOSIS — L700 Acne vulgaris: Secondary | ICD-10-CM | POA: Diagnosis not present

## 2024-05-12 DIAGNOSIS — L738 Other specified follicular disorders: Secondary | ICD-10-CM | POA: Diagnosis not present

## 2024-05-12 DIAGNOSIS — L578 Other skin changes due to chronic exposure to nonionizing radiation: Secondary | ICD-10-CM

## 2024-05-12 DIAGNOSIS — L821 Other seborrheic keratosis: Secondary | ICD-10-CM

## 2024-05-12 DIAGNOSIS — D229 Melanocytic nevi, unspecified: Secondary | ICD-10-CM

## 2024-05-12 NOTE — Patient Instructions (Signed)

## 2024-05-12 NOTE — Progress Notes (Signed)
   Follow-Up Visit   Subjective  Michele Medina is a 48 y.o. female who presents for the following: Skin Cancer Screening and Full Body Skin Exam - No history of skin cancer or abnormal moles  She is also her for an acne follow up.She is using clindamycin  every morning and Altreno  2 nights per week. She feels she is better but she still gets some breakouts.  The patient presents for Total-Body Skin Exam (TBSE) for skin cancer screening and mole check. The patient has spots, moles and lesions to be evaluated, some may be new or changing and the patient may have concern these could be cancer.    The following portions of the chart were reviewed this encounter and updated as appropriate: medications, allergies, medical history  Review of Systems:  No other skin or systemic complaints except as noted in HPI or Assessment and Plan.  Objective  Well appearing patient in no apparent distress; mood and affect are within normal limits.  A full examination was performed including scalp, head, eyes, ears, nose, lips, neck, chest, axillae, abdomen, back, buttocks, bilateral upper extremities, bilateral lower extremities, hands, feet, fingers, toes, fingernails, and toenails. All findings within normal limits unless otherwise noted below.   Relevant physical exam findings are noted in the Assessment and Plan.    Assessment & Plan   SKIN CANCER SCREENING PERFORMED TODAY.  ACTINIC DAMAGE - Chronic condition, secondary to cumulative UV/sun exposure - diffuse scaly erythematous macules with underlying dyspigmentation - Recommend daily broad spectrum sunscreen SPF 30+ to sun-exposed areas, reapply every 2 hours as needed.  - Staying in the shade or wearing long sleeves, sun glasses (UVA+UVB protection) and wide brim hats (4-inch brim around the entire circumference of the hat) are also recommended for sun protection.  - Call for new or changing lesions.  LENTIGINES, SEBORRHEIC KERATOSES,  HEMANGIOMAS - Benign normal skin lesions - Benign-appearing - Call for any changes  MELANOCYTIC NEVI - Tan-brown and/or pink-flesh-colored symmetric macules and papules - Benign appearing on exam today - Observation - Call clinic for new or changing moles - Recommend daily use of broad spectrum spf 30+ sunscreen to sun-exposed areas.   ACNE VULGARIS Exam: Open comedones mostly of chin and jawline   Treatment Plan: Continue clindamycin  swabs every morning.  Try to increase Altreno  lotion to 3 nights per week. Recommend applying Avene Cicalfate cream nightly.  Fordyce Spots Exam: Flesh papules of upper lip  Treatment Plan: Benign-appearing.  Observation.  Call clinic for new or changing lesions.  Recommend daily use of broad spectrum spf 30+ sunscreen to sun-exposed areas.          Return in about 8 months (around 01/09/2025) for Acne.  I, Roseline Hutchinson, CMA, am acting as scribe for [employer], DO .   Documentation: I have reviewed the above documentation for accuracy and completeness, and I agree with the above.  Delon Lenis, DO

## 2024-05-22 DIAGNOSIS — N939 Abnormal uterine and vaginal bleeding, unspecified: Secondary | ICD-10-CM | POA: Diagnosis not present

## 2024-05-22 DIAGNOSIS — R87619 Unspecified abnormal cytological findings in specimens from cervix uteri: Secondary | ICD-10-CM | POA: Diagnosis not present

## 2024-07-21 ENCOUNTER — Other Ambulatory Visit (HOSPITAL_COMMUNITY): Payer: Self-pay

## 2024-07-21 ENCOUNTER — Other Ambulatory Visit: Payer: Self-pay

## 2024-10-02 ENCOUNTER — Other Ambulatory Visit: Payer: Self-pay | Admitting: Dermatology

## 2024-10-03 ENCOUNTER — Other Ambulatory Visit (HOSPITAL_COMMUNITY): Payer: Self-pay

## 2024-10-03 MED ORDER — CLINDAMYCIN PHOSPHATE 1 % EX SWAB
1.0000 | Freq: Every morning | CUTANEOUS | 3 refills | Status: AC
  Filled 2024-10-03: qty 60, 60d supply, fill #0

## 2025-01-12 ENCOUNTER — Ambulatory Visit: Admitting: Dermatology
